# Patient Record
Sex: Male | Born: 1980 | Race: White | Hispanic: No | Marital: Single | Smoking: Former smoker
Health system: Southern US, Community
[De-identification: ages and names within clinical notes are randomized; demographics above are authoritative.]

## PROBLEM LIST (undated history)

## (undated) DIAGNOSIS — E785 Hyperlipidemia, unspecified: Secondary | ICD-10-CM

## (undated) DIAGNOSIS — J45909 Unspecified asthma, uncomplicated: Secondary | ICD-10-CM

## (undated) DIAGNOSIS — I10 Essential (primary) hypertension: Secondary | ICD-10-CM

## (undated) HISTORY — DX: Hyperlipidemia, unspecified: E78.5

## (undated) HISTORY — DX: Unspecified asthma, uncomplicated: J45.909

---
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
## 2004-07-07 ENCOUNTER — Emergency Department: Payer: Self-pay | Admitting: Emergency Medicine

## 2009-12-26 ENCOUNTER — Ambulatory Visit: Payer: Self-pay | Admitting: Internal Medicine

## 2010-04-15 ENCOUNTER — Emergency Department: Payer: Self-pay | Admitting: Emergency Medicine

## 2016-12-26 DIAGNOSIS — Z23 Encounter for immunization: Secondary | ICD-10-CM | POA: Diagnosis not present

## 2016-12-26 DIAGNOSIS — Z0000 Encounter for general adult medical examination without abnormal findings: Secondary | ICD-10-CM | POA: Diagnosis not present

## 2017-03-11 DIAGNOSIS — R69 Illness, unspecified: Secondary | ICD-10-CM | POA: Diagnosis not present

## 2017-03-11 DIAGNOSIS — R6889 Other general symptoms and signs: Secondary | ICD-10-CM | POA: Diagnosis not present

## 2017-04-03 DIAGNOSIS — R05 Cough: Secondary | ICD-10-CM | POA: Diagnosis not present

## 2017-04-03 DIAGNOSIS — J029 Acute pharyngitis, unspecified: Secondary | ICD-10-CM | POA: Diagnosis not present

## 2017-04-03 DIAGNOSIS — J40 Bronchitis, not specified as acute or chronic: Secondary | ICD-10-CM | POA: Diagnosis not present

## 2019-02-22 ENCOUNTER — Telehealth: Payer: Self-pay | Admitting: Emergency Medicine

## 2019-02-22 ENCOUNTER — Other Ambulatory Visit: Payer: Self-pay

## 2019-02-22 ENCOUNTER — Emergency Department: Payer: BC Managed Care – PPO

## 2019-02-22 ENCOUNTER — Inpatient Hospital Stay
Admission: EM | Admit: 2019-02-22 | Discharge: 2019-02-22 | DRG: 177 | Disposition: A | Discharge status: Home / Self Care | Payer: BC Managed Care – PPO | Attending: Internal Medicine | Admitting: Internal Medicine

## 2019-02-22 ENCOUNTER — Inpatient Hospital Stay (HOSPITAL_COMMUNITY)
Admission: AD | Admit: 2019-02-22 | Discharge: 2019-02-26 | DRG: 177 | Disposition: A | Discharge status: Home / Self Care | Payer: BC Managed Care – PPO | Source: Other Acute Inpatient Hospital | Attending: Internal Medicine | Admitting: Internal Medicine

## 2019-02-22 ENCOUNTER — Encounter (HOSPITAL_COMMUNITY): Payer: Self-pay | Admitting: Internal Medicine

## 2019-02-22 ENCOUNTER — Encounter: Payer: Self-pay | Admitting: Emergency Medicine

## 2019-02-22 DIAGNOSIS — J9601 Acute respiratory failure with hypoxia: Secondary | ICD-10-CM

## 2019-02-22 DIAGNOSIS — E860 Dehydration: Secondary | ICD-10-CM | POA: Diagnosis present

## 2019-02-22 DIAGNOSIS — R0600 Dyspnea, unspecified: Secondary | ICD-10-CM | POA: Diagnosis not present

## 2019-02-22 DIAGNOSIS — E871 Hypo-osmolality and hyponatremia: Secondary | ICD-10-CM | POA: Diagnosis present

## 2019-02-22 DIAGNOSIS — Z888 Allergy status to other drugs, medicaments and biological substances status: Secondary | ICD-10-CM | POA: Diagnosis not present

## 2019-02-22 DIAGNOSIS — F1010 Alcohol abuse, uncomplicated: Secondary | ICD-10-CM | POA: Insufficient documentation

## 2019-02-22 DIAGNOSIS — U071 COVID-19: Secondary | ICD-10-CM

## 2019-02-22 DIAGNOSIS — E876 Hypokalemia: Secondary | ICD-10-CM | POA: Diagnosis present

## 2019-02-22 DIAGNOSIS — A0839 Other viral enteritis: Secondary | ICD-10-CM | POA: Diagnosis present

## 2019-02-22 DIAGNOSIS — Z79899 Other long term (current) drug therapy: Secondary | ICD-10-CM

## 2019-02-22 DIAGNOSIS — J9691 Respiratory failure, unspecified with hypoxia: Secondary | ICD-10-CM | POA: Diagnosis present

## 2019-02-22 DIAGNOSIS — F419 Anxiety disorder, unspecified: Secondary | ICD-10-CM | POA: Diagnosis present

## 2019-02-22 DIAGNOSIS — R509 Fever, unspecified: Secondary | ICD-10-CM | POA: Diagnosis not present

## 2019-02-22 DIAGNOSIS — J1282 Pneumonia due to coronavirus disease 2019: Secondary | ICD-10-CM | POA: Diagnosis present

## 2019-02-22 DIAGNOSIS — D6959 Other secondary thrombocytopenia: Secondary | ICD-10-CM | POA: Diagnosis present

## 2019-02-22 HISTORY — DX: COVID-19: U07.1

## 2019-02-22 HISTORY — DX: Essential (primary) hypertension: I10

## 2019-02-22 LAB — RAPID URINE DRUG SCREEN, HOSP PERFORMED
Amphetamines: NOT DETECTED
Barbiturates: NOT DETECTED
Benzodiazepines: NOT DETECTED
Cocaine: NOT DETECTED
Opiates: NOT DETECTED
Tetrahydrocannabinol: NOT DETECTED

## 2019-02-22 LAB — CBC WITH DIFFERENTIAL/PLATELET
Abs Immature Granulocytes: 0.02 10*3/uL (ref 0.00–0.07)
Abs Immature Granulocytes: 0.02 10*3/uL (ref 0.00–0.07)
Basophils Absolute: 0 10*3/uL (ref 0.0–0.1)
Basophils Absolute: 0 10*3/uL (ref 0.0–0.1)
Basophils Relative: 0 %
Basophils Relative: 0 %
Eosinophils Absolute: 0 10*3/uL (ref 0.0–0.5)
Eosinophils Absolute: 0 10*3/uL (ref 0.0–0.5)
Eosinophils Relative: 0 %
Eosinophils Relative: 0 %
HCT: 41.6 % (ref 39.0–52.0)
HCT: 39.2 % (ref 39.0–52.0)
Hemoglobin: 14.7 g/dL (ref 13.0–17.0)
Hemoglobin: 13.8 g/dL (ref 13.0–17.0)
Immature Granulocytes: 0 %
Immature Granulocytes: 1 %
Lymphocytes Relative: 8 %
Lymphocytes Relative: 9 %
Lymphs Abs: 0.5 10*3/uL — ABNORMAL LOW (ref 0.7–4.0)
Lymphs Abs: 0.4 10*3/uL — ABNORMAL LOW (ref 0.7–4.0)
MCH: 32.2 pg (ref 26.0–34.0)
MCH: 32.5 pg (ref 26.0–34.0)
MCHC: 35.3 g/dL (ref 30.0–36.0)
MCHC: 35.2 g/dL (ref 30.0–36.0)
MCV: 91.2 fL (ref 80.0–100.0)
MCV: 92.5 fL (ref 80.0–100.0)
Monocytes Absolute: 0.4 10*3/uL (ref 0.1–1.0)
Monocytes Absolute: 0.2 10*3/uL (ref 0.1–1.0)
Monocytes Relative: 7 %
Monocytes Relative: 6 %
Neutro Abs: 5.5 10*3/uL (ref 1.7–7.7)
Neutro Abs: 3.2 10*3/uL (ref 1.7–7.7)
Neutrophils Relative %: 85 %
Neutrophils Relative %: 84 %
Platelets: 106 10*3/uL — ABNORMAL LOW (ref 150–400)
Platelets: 107 10*3/uL — ABNORMAL LOW (ref 150–400)
RBC: 4.56 MIL/uL (ref 4.22–5.81)
RBC: 4.24 MIL/uL (ref 4.22–5.81)
RDW: 11.7 % (ref 11.5–15.5)
RDW: 11.9 % (ref 11.5–15.5)
Smear Review: DECREASED
WBC: 6.5 10*3/uL (ref 4.0–10.5)
WBC: 3.8 10*3/uL — ABNORMAL LOW (ref 4.0–10.5)
nRBC: 0 % (ref 0.0–0.2)
nRBC: 0 % (ref 0.0–0.2)

## 2019-02-22 LAB — COMPREHENSIVE METABOLIC PANEL
ALT: 43 U/L (ref 0–44)
ALT: 47 U/L — ABNORMAL HIGH (ref 0–44)
AST: 75 U/L — ABNORMAL HIGH (ref 15–41)
AST: 77 U/L — ABNORMAL HIGH (ref 15–41)
Albumin: 3.7 g/dL (ref 3.5–5.0)
Albumin: 3.4 g/dL — ABNORMAL LOW (ref 3.5–5.0)
Alkaline Phosphatase: 39 U/L (ref 38–126)
Alkaline Phosphatase: 36 U/L — ABNORMAL LOW (ref 38–126)
Anion gap: 13 (ref 5–15)
Anion gap: 9 (ref 5–15)
BUN: 13 mg/dL (ref 6–20)
BUN: 11 mg/dL (ref 6–20)
CO2: 21 mmol/L — ABNORMAL LOW (ref 22–32)
CO2: 23 mmol/L (ref 22–32)
Calcium: 7.9 mg/dL — ABNORMAL LOW (ref 8.9–10.3)
Calcium: 8 mg/dL — ABNORMAL LOW (ref 8.9–10.3)
Chloride: 95 mmol/L — ABNORMAL LOW (ref 98–111)
Chloride: 101 mmol/L (ref 98–111)
Creatinine, Ser: 1.14 mg/dL (ref 0.61–1.24)
Creatinine, Ser: 1.05 mg/dL (ref 0.61–1.24)
GFR calc Af Amer: >60 mL/min (ref >60)
GFR calc Af Amer: >60 mL/min (ref >60)
GFR calc non Af Amer: >60 mL/min (ref >60)
GFR calc non Af Amer: >60 mL/min (ref >60)
Glucose, Bld: 101 mg/dL — ABNORMAL HIGH (ref 70–99)
Glucose, Bld: 133 mg/dL — ABNORMAL HIGH (ref 70–99)
Potassium: 3.3 mmol/L — ABNORMAL LOW (ref 3.5–5.1)
Potassium: 3.8 mmol/L (ref 3.5–5.1)
Sodium: 129 mmol/L — ABNORMAL LOW (ref 135–145)
Sodium: 133 mmol/L — ABNORMAL LOW (ref 135–145)
Total Bilirubin: 0.8 mg/dL (ref 0.3–1.2)
Total Bilirubin: 0.4 mg/dL (ref 0.3–1.2)
Total Protein: 7.7 g/dL (ref 6.5–8.1)
Total Protein: 6.9 g/dL (ref 6.5–8.1)

## 2019-02-22 LAB — URINALYSIS, ROUTINE W REFLEX MICROSCOPIC
Bacteria, UA: NONE SEEN
Bilirubin Urine: NEGATIVE
Glucose, UA: NEGATIVE mg/dL
Hgb urine dipstick: NEGATIVE
Ketones, ur: 20 mg/dL — AB
Leukocytes,Ua: NEGATIVE
Nitrite: NEGATIVE
Protein, ur: 30 mg/dL — AB
Specific Gravity, Urine: 1.016 (ref 1.005–1.030)
pH: 5 (ref 5.0–8.0)

## 2019-02-22 LAB — ETHANOL: Alcohol, Ethyl (B): <10 mg/dL (ref <10)

## 2019-02-22 LAB — MAGNESIUM
Magnesium: 2.5 mg/dL — ABNORMAL HIGH (ref 1.7–2.4)
Magnesium: 2.3 mg/dL (ref 1.7–2.4)

## 2019-02-22 LAB — C-REACTIVE PROTEIN
CRP: 12.9 mg/dL — ABNORMAL HIGH (ref <1.0)
CRP: 13.5 mg/dL — ABNORMAL HIGH (ref <1.0)

## 2019-02-22 LAB — PROCALCITONIN
Procalcitonin: <0.1 ng/mL
Procalcitonin: <0.1 ng/mL

## 2019-02-22 LAB — TYPE AND SCREEN
ABO/RH(D): A POS
Antibody Screen: NEGATIVE

## 2019-02-22 LAB — CBC
HCT: 38.5 % — ABNORMAL LOW (ref 39.0–52.0)
Hemoglobin: 13.5 g/dL (ref 13.0–17.0)
MCH: 32.4 pg (ref 26.0–34.0)
MCHC: 35.1 g/dL (ref 30.0–36.0)
MCV: 92.3 fL (ref 80.0–100.0)
Platelets: 96 10*3/uL — ABNORMAL LOW (ref 150–400)
RBC: 4.17 MIL/uL — ABNORMAL LOW (ref 4.22–5.81)
RDW: 11.8 % (ref 11.5–15.5)
WBC: 5.2 10*3/uL (ref 4.0–10.5)
nRBC: 0 % (ref 0.0–0.2)

## 2019-02-22 LAB — ABO/RH
ABO/RH(D): A POS
ABO/RH(D): A POS

## 2019-02-22 LAB — TROPONIN I (HIGH SENSITIVITY)
Troponin I (High Sensitivity): 5 ng/L (ref <18)
Troponin I (High Sensitivity): 4 ng/L (ref <18)
Troponin I (High Sensitivity): 3 ng/L (ref <18)

## 2019-02-22 LAB — PHOSPHORUS: Phosphorus: 2.7 mg/dL (ref 2.5–4.6)

## 2019-02-22 LAB — FERRITIN
Ferritin: 769 ng/mL — ABNORMAL HIGH (ref 24–336)
Ferritin: 738 ng/mL — ABNORMAL HIGH (ref 24–336)

## 2019-02-22 LAB — CREATININE, SERUM
Creatinine, Ser: 1.09 mg/dL (ref 0.61–1.24)
GFR calc Af Amer: >60 mL/min (ref >60)
GFR calc non Af Amer: >60 mL/min (ref >60)

## 2019-02-22 LAB — HIV ANTIBODY (ROUTINE TESTING W REFLEX): HIV Screen 4th Generation wRfx: NONREACTIVE

## 2019-02-22 LAB — BRAIN NATRIURETIC PEPTIDE
B Natriuretic Peptide: 19 pg/mL (ref 0.0–100.0)
B Natriuretic Peptide: 35.1 pg/mL (ref 0.0–100.0)

## 2019-02-22 LAB — D-DIMER, QUANTITATIVE: D-Dimer, Quant: 1.25 ug/mL-FEU — ABNORMAL HIGH (ref 0.00–0.50)

## 2019-02-22 LAB — TRIGLYCERIDES: Triglycerides: 112 mg/dL (ref <150)

## 2019-02-22 LAB — FIBRINOGEN: Fibrinogen: 635 mg/dL — ABNORMAL HIGH (ref 210–475)

## 2019-02-22 LAB — LACTATE DEHYDROGENASE: LDH: 442 U/L — ABNORMAL HIGH (ref 98–192)

## 2019-02-22 LAB — LACTIC ACID, PLASMA
Lactic Acid, Venous: 1 mmol/L (ref 0.5–1.9)
Lactic Acid, Venous: 1 mmol/L (ref 0.5–1.9)

## 2019-02-22 LAB — HEPATITIS B SURFACE ANTIGEN: Hepatitis B Surface Ag: NONREACTIVE

## 2019-02-22 LAB — FIBRIN DERIVATIVES D-DIMER (ARMC ONLY): Fibrin derivatives D-dimer (ARMC): 1387.99 ng/mL (FEU) — ABNORMAL HIGH (ref 0.00–499.00)

## 2019-02-22 MED ORDER — ENOXAPARIN SODIUM 40 MG/0.4ML ~~LOC~~ SOLN: 40.0000 mg | SUBCUTANEOUS | Status: DC

## 2019-02-22 MED ORDER — SODIUM CHLORIDE 0.9 % IV SOLN
200.0000 mg | Freq: Once | INTRAVENOUS | Status: AC
  Administered 2019-02-22: 200 mg via INTRAVENOUS
  Filled 2019-02-22: qty 200

## 2019-02-22 MED ORDER — LACTATED RINGERS IV SOLN: INTRAVENOUS | Status: DC

## 2019-02-22 MED ORDER — SODIUM CHLORIDE 0.9 % IV SOLN: 100.0000 mg | Freq: Every day | INTRAVENOUS | Status: DC

## 2019-02-22 MED ORDER — SODIUM CHLORIDE 0.9 % IV SOLN
100.0000 mg | Freq: Every day | INTRAVENOUS | Status: AC
  Administered 2019-02-23 – 2019-02-26 (×4): 100 mg via INTRAVENOUS
  Filled 2019-02-22 (×4): qty 20

## 2019-02-22 MED ORDER — ONDANSETRON HCL 4 MG/2ML IJ SOLN: 4.0000 mg | Freq: Four times a day (QID) | INTRAMUSCULAR | Status: DC | PRN

## 2019-02-22 MED ORDER — ONDANSETRON HCL 4 MG PO TABS: 4.0000 mg | ORAL_TABLET | Freq: Four times a day (QID) | ORAL | Status: DC | PRN

## 2019-02-22 MED ORDER — DEXAMETHASONE SODIUM PHOSPHATE 10 MG/ML IJ SOLN
6.0000 mg | INTRAMUSCULAR | Status: DC
  Administered 2019-02-23 – 2019-02-26 (×4): 6 mg via INTRAVENOUS
  Filled 2019-02-22 (×4): qty 1

## 2019-02-22 MED ORDER — THIAMINE HCL 100 MG/ML IJ SOLN
100.0000 mg | Freq: Every day | INTRAMUSCULAR | Status: DC
  Administered 2019-02-22 – 2019-02-23 (×2): 100 mg via INTRAVENOUS
  Filled 2019-02-22 (×4): qty 1

## 2019-02-22 MED ORDER — ALBUTEROL SULFATE HFA 108 (90 BASE) MCG/ACT IN AERS
2.0000 | INHALATION_SPRAY | Freq: Four times a day (QID) | RESPIRATORY_TRACT | Status: DC
  Filled 2019-02-22: qty 6.7

## 2019-02-22 MED ORDER — DEXAMETHASONE SODIUM PHOSPHATE 10 MG/ML IJ SOLN
6.0000 mg | Freq: Once | INTRAMUSCULAR | Status: AC
  Administered 2019-02-22: 12:00:00 6 mg via INTRAVENOUS
  Filled 2019-02-22: qty 1

## 2019-02-22 MED ORDER — ZINC SULFATE 220 (50 ZN) MG PO CAPS
220.0000 mg | ORAL_CAPSULE | Freq: Every day | ORAL | Status: DC
  Administered 2019-02-22 – 2019-02-26 (×5): 220 mg via ORAL
  Filled 2019-02-22 (×5): qty 1

## 2019-02-22 MED ORDER — ASCORBIC ACID 500 MG PO TABS
500.0000 mg | ORAL_TABLET | Freq: Every day | ORAL | Status: DC
  Administered 2019-02-22 – 2019-02-26 (×5): 500 mg via ORAL
  Filled 2019-02-22 (×5): qty 1

## 2019-02-22 MED ORDER — DEXAMETHASONE SODIUM PHOSPHATE 10 MG/ML IJ SOLN: 6.0000 mg | INTRAMUSCULAR | Status: DC

## 2019-02-22 MED ORDER — IPRATROPIUM-ALBUTEROL 20-100 MCG/ACT IN AERS
1.0000 | INHALATION_SPRAY | Freq: Four times a day (QID) | RESPIRATORY_TRACT | Status: DC
  Administered 2019-02-22 – 2019-02-26 (×13): 1 via RESPIRATORY_TRACT
  Filled 2019-02-22: qty 4

## 2019-02-22 MED ORDER — POLYETHYLENE GLYCOL 3350 17 G PO PACK: 17.0000 g | PACK | Freq: Every day | ORAL | Status: DC | PRN

## 2019-02-22 MED ORDER — GUAIFENESIN-DM 100-10 MG/5ML PO SYRP
10.0000 mL | ORAL_SOLUTION | ORAL | Status: DC | PRN
  Administered 2019-02-22 – 2019-02-23 (×5): 10 mL via ORAL
  Filled 2019-02-22 (×5): qty 10

## 2019-02-22 MED ORDER — SODIUM CHLORIDE 0.9 % IV SOLN: 200.0000 mg | Freq: Once | INTRAVENOUS | Status: DC

## 2019-02-22 MED ORDER — ASPIRIN EC 81 MG PO TBEC: 81.0000 mg | DELAYED_RELEASE_TABLET | Freq: Every day | ORAL | Status: DC

## 2019-02-22 MED ORDER — ENOXAPARIN SODIUM 60 MG/0.6ML ~~LOC~~ SOLN
60.0000 mg | SUBCUTANEOUS | Status: DC
  Administered 2019-02-22 – 2019-02-23 (×2): 60 mg via SUBCUTANEOUS
  Filled 2019-02-22 (×2): qty 0.6

## 2019-02-22 MED ORDER — HYDROCODONE-ACETAMINOPHEN 5-325 MG PO TABS: 1.0000 | ORAL_TABLET | ORAL | Status: DC | PRN

## 2019-02-22 MED ORDER — ACETAMINOPHEN 325 MG PO TABS: 650.0000 mg | ORAL_TABLET | Freq: Four times a day (QID) | ORAL | Status: DC | PRN

## 2019-02-22 MED ORDER — ASCORBIC ACID 500 MG PO TABS: 500.0000 mg | ORAL_TABLET | Freq: Every day | ORAL | Status: DC

## 2019-02-22 MED ORDER — SODIUM CHLORIDE 0.9 % IV SOLN
INTRAVENOUS | Status: DC
  Administered 2019-02-23: 1000 mL via INTRAVENOUS

## 2019-02-22 MED ORDER — PANTOPRAZOLE SODIUM 40 MG IV SOLR: 40.0000 mg | INTRAVENOUS | Status: DC

## 2019-02-22 MED ORDER — SODIUM CHLORIDE 0.9 % IV BOLUS
1000.0000 mL | Freq: Once | INTRAVENOUS | Status: AC
  Administered 2019-02-22: 1000 mL via INTRAVENOUS

## 2019-02-22 MED ORDER — SODIUM CHLORIDE 0.9 % IV SOLN
100.0000 mg | Freq: Every day | INTRAVENOUS | Status: DC
  Filled 2019-02-22: qty 20

## 2019-02-22 MED ORDER — HYDROCOD POLST-CPM POLST ER 10-8 MG/5ML PO SUER: 5.0000 mL | Freq: Two times a day (BID) | ORAL | Status: DC | PRN

## 2019-02-22 MED ORDER — ZINC SULFATE 220 (50 ZN) MG PO CAPS: 220.0000 mg | ORAL_CAPSULE | Freq: Every day | ORAL | Status: DC

## 2019-02-22 MED ORDER — FOLIC ACID 5 MG/ML IJ SOLN
1.0000 mg | Freq: Every day | INTRAMUSCULAR | Status: DC
  Administered 2019-02-22 – 2019-02-23 (×2): 1 mg via INTRAVENOUS
  Filled 2019-02-22 (×3): qty 0.2

## 2019-02-22 MED ORDER — ONDANSETRON HCL 4 MG/2ML IJ SOLN
4.0000 mg | Freq: Once | INTRAMUSCULAR | Status: AC
  Administered 2019-02-22: 4 mg via INTRAVENOUS
  Filled 2019-02-22: qty 2

## 2019-02-22 MED ORDER — TRAMADOL HCL 50 MG PO TABS: 50.0000 mg | ORAL_TABLET | Freq: Four times a day (QID) | ORAL | Status: DC | PRN

## 2019-02-22 NOTE — ED Notes (Signed)
Pt visualized in NAD, urinal emptied by this RN. Pt states he is feeling better. Remains on 3L via [place] at this time.

## 2019-02-22 NOTE — ED Provider Notes (Signed)
[facility] Emergency Department Provider Note  ____________________________________________   First MD Initiated Contact with Patient 02/22/19 1028     (approximate)  I have reviewed the triage vital signs and the nursing notes.   HISTORY  Chief Complaint Fever and Covid +    HPI Cody Koch is a 39 y.o. male who was Covid positive a few days ago who now presents with continued fevers, shortness of breath.  Patient states that his shortness of breath has been worsening, severe, nothing makes better, nothing makes it worse.  He is also been having fevers and unable to get his temperature down.  Last Tylenol this morning.  He also reports not eating and drinking as well.  While in the lobby he did have a syncopal episode but he did not hit his head.     History reviewed. No pertinent past medical history.  There are no problems to display for this patient.   History reviewed. No pertinent surgical history.  Prior to Admission medications   Not on File    Allergies Ceclor [cefaclor]  No family history on file.  Social History Social History   Tobacco Use  . Smoking status: Never Smoker  . Smokeless tobacco: Never Used  Substance Use Topics  . Alcohol use: Never  . Drug use: Not on file      Review of Systems Constitutional: Positive fevers and syncope Eyes: No visual changes. ENT: No sore throat. Cardiovascular: Denies chest pain. Respiratory: Positive shortness of breath Gastrointestinal: No abdominal pain.  Positive nausea no vomiting.  No diarrhea.  No constipation. Genitourinary: Negative for dysuria. Musculoskeletal: Negative for back pain. Skin: Negative for rash. Neurological: Negative for headaches, focal weakness or numbness. All other ROS negative ____________________________________________   PHYSICAL EXAM:  VITAL SIGNS: ED Triage Vitals  Enc Vitals Group     BP 02/22/19 1007 (!) 108/56     Pulse Rate  02/22/19 1007 79     Resp 02/22/19 1007 (!) 22     Temp 02/22/19 1007 (!) 100.4 F (38 C)     Temp Source 02/22/19 1007 Oral     SpO2 02/22/19 1007 91 %     Weight 02/22/19 1008 280 lb (127 kg)     Height 02/22/19 1008 6' (1.829 m)     Head Circumference --      Peak Flow --      Pain Score 02/22/19 1007 6     Pain Loc --      Pain Edu? --      Excl. in [place]? --     Constitutional: Alert and oriented. Eyes: Conjunctivae are normal. EOMI. Head: Atraumatic. Nose: No congestion/rhinnorhea. Mouth/Throat: Mucous membranes are moist.   Neck: No stridor. Trachea Midline. FROM Cardiovascular: Normal rate, regular rhythm. Grossly normal heart sounds.  Good peripheral circulation. Respiratory: Normal respiratory effort.  No retractions. Lungs CTAB.  Patient is satting 88% on room air.  Patient was placed on 2 L Gastrointestinal: Soft and nontender. No distention. No abdominal bruits.  Musculoskeletal: No lower extremity tenderness nor edema.  No joint effusions. Neurologic:  Normal speech and language. No gross focal neurologic deficits are appreciated.  Skin:  Skin is warm, dry and intact. No rash noted. Psychiatric: Mood and affect are normal. Speech and behavior are normal. GU: Deferred   ____________________________________________   LABS (all labs ordered are listed, but only abnormal results are displayed)  Labs Reviewed  CBC WITH DIFFERENTIAL/PLATELET - Abnormal; Notable for the  following components:      Result Value   Platelets 106 (*)    Lymphs Abs 0.5 (*)    All other components within normal limits  COMPREHENSIVE METABOLIC PANEL - Abnormal; Notable for the following components:   Sodium 129 (*)    Potassium 3.3 (*)    Chloride 95 (*)    CO2 21 (*)    Glucose, Bld 101 (*)    Calcium 7.9 (*)    AST 75 (*)    All other components within normal limits  FIBRIN DERIVATIVES D-DIMER ([facility] ONLY) - Abnormal; Notable for the following components:   Fibrin derivatives D-dimer  ([facility]) 1,387.99 (*)    All other components within normal limits  LACTATE DEHYDROGENASE - Abnormal; Notable for the following components:   LDH 442 (*)    All other components within normal limits  FERRITIN - Abnormal; Notable for the following components:   Ferritin 769 (*)    All other components within normal limits  FIBRINOGEN - Abnormal; Notable for the following components:   Fibrinogen 635 (*)    All other components within normal limits  CULTURE, BLOOD (ROUTINE X 2)  CULTURE, BLOOD (ROUTINE X 2)  LACTIC ACID, PLASMA  TRIGLYCERIDES  BRAIN NATRIURETIC PEPTIDE  LACTIC ACID, PLASMA  PROCALCITONIN  C-REACTIVE PROTEIN  URINALYSIS, ROUTINE W REFLEX MICROSCOPIC  TROPONIN I (HIGH SENSITIVITY)  TROPONIN I (HIGH SENSITIVITY)   ____________________________________________   ED ECG REPORT I, Concha Se, the attending physician, personally viewed and interpreted this ECG.  EKG is normal sinus rate of 88, no ST elevation, T wave inversion in lead III, normal intervals ____________________________________________  RADIOLOGY Vela Prose, personally viewed and evaluated these images (plain radiographs) as part of my medical decision making, as well as reviewing the written report by the radiologist.  ED MD interpretation: Bilateral infiltrates consistent with coronavirus  Official radiology report(s): DG Chest Port 1 View  Result Date: 02/22/2019 CLINICAL DATA:  Fever and shortness of breath. EXAM: PORTABLE CHEST 1 VIEW COMPARISON:  None. FINDINGS: The cardiac silhouette, mediastinal and hilar contours are within normal limits. Multifocal patchy lung infiltrates suggesting atypical/viral pneumonia such as COVID pneumonia. Recommend clinical correlation. No pleural effusions. The bony thorax is intact. IMPRESSION: Diffuse lung infiltrates. Electronically Signed   By: Rudie Meyer M.D.   On: 02/22/2019 11:33     ____________________________________________   PROCEDURES  Procedure(s) performed (including Critical Care):  .Critical Care Performed by: Concha Se, MD Authorized by: Concha Se, MD   Critical care provider statement:    Critical care time (minutes):  31   Critical care was necessary to treat or prevent imminent or life-threatening deterioration of the following conditions:  Respiratory failure   Critical care was time spent personally by me on the following activities:  Discussions with consultants, evaluation of patient's response to treatment, examination of patient, ordering and performing treatments and interventions, ordering and review of laboratory studies, ordering and review of radiographic studies, pulse oximetry, re-evaluation of patient's condition, obtaining history from patient or surrogate and review of old charts     ____________________________________________   INITIAL IMPRESSION / ASSESSMENT AND PLAN / ED COURSE  Cody Koch was evaluated in Emergency Department on 02/22/2019 for the symptoms described in the history of present illness. He was evaluated in the context of the global COVID-19 pandemic, which necessitated consideration that the patient might be at risk for infection with the SARS-CoV-2 virus that causes COVID-19. Institutional protocols and algorithms  that pertain to the evaluation of patients at risk for COVID-19 are in a state of rapid change based on information released by regulatory bodies including the CDC and federal and state organizations. These policies and algorithms were followed during the patient's care in the ED.    Patient presents with shortness of breath, fevers in the setting of coronavirus.  Most likely secondary to coronavirus.  Patient is hypoxic and will require admission.  Will get labs to evaluate for electrolyte abnormalities, AKI, bacterial pneumonia, bacteremia.  Hold off on antibiotics at this time given he is  well-appearing.  Patient's labs are reassuring.  Does have a little bit low sodium and chloride most consistent with some dehydration.  Patient is already getting fluids.  Patient is currently on 3 L of oxygen.  Will discuss with the hospital team admission.   ____________________________________________   FINAL CLINICAL IMPRESSION(S) / ED DIAGNOSES   Final diagnoses:  Pneumonia due to COVID-19 virus  Acute respiratory failure with hypoxia (HCC)      MEDICATIONS GIVEN DURING THIS VISIT:  Medications  dexamethasone (DECADRON) injection 6 mg (6 mg Intravenous Given 02/22/19 1131)  ondansetron (ZOFRAN) injection 4 mg (4 mg Intravenous Given 02/22/19 1131)  sodium chloride 0.9 % bolus 1,000 mL (1,000 mLs Intravenous New Bag/Given 02/22/19 1134)     ED Discharge Orders    None       Note:  This document was prepared using Dragon voice recognition software and may include unintentional dictation errors.   Concha Se, MD 02/22/19 [id number]

## 2019-02-22 NOTE — Discharge Summary (Signed)
Physician Discharge Summary  Cody Koch:[id number] DOB: 1980-12-12 DOA: 02/22/2019  PCP: System, Pcp Not In  Admit date: 02/22/2019 Discharge date: 02/22/2019  Time spent: 20 minutes  Recommendations for Outpatient Follow-up:  1.  [facility].  Discharge Diagnoses:  Active Problems:   Pneumonia due to COVID-19 virus   Hyponatremia   Hypokalemia  Covid-19 PNA: Pt to be started on Covid-19 regimen for acute respiratory syndrome due to covid-19 pneumonia. -remdesivir/decadron.vit c and zinc.  -oxygen to keep goal sat 93 and above. -additionally monitro liver and kidney function and supportive care.  Hyponatremia: -Mild due to dehydration. Will start pt on NS at 50 ml x 24 hours.  -repeat BMP and follow. Hypokalemia: Will give pt 20 meq of kcl x 1 iv run. -check magnesium and follow potassium    Discharge Condition: stable  Diet recommendation: soft diet  Filed Weights   02/22/19 1008  Weight: 127 kg    History of present illness:  Cody Koch is a 39 y.o. male with no significant medical history  Seen for fatigue, myalgia, diarrhea  And dad has covid-19, he has been quarantining himself since last Sunday when the symptoms started. Past two days he has been very short of breath and o2 on pulse oximetry was 80's so he came in today.   Hospital Course:  Pt started on remdesivir and covid-19 regimen and received email abotu plan for trasnfer to covid facility per flow manager. Pt agrees for transfer.   Discharge Exam: Vitals:   02/22/19 1500 02/22/19 1538  BP: 136/75 (!) 152/93  Pulse: 93 93  Resp: (!) 22 20  Temp:  99 F (37.2 C)  SpO2: 91% 93%   Eyes: PERRL, lids and conjunctivae normal ENMT: Mucous membranes are moist. Posterior pharynx clear of any exudate or lesions.Normal dentition.  Neck: normal, supple, no masses, no thyromegaly Respiratory: clear to auscultation bilaterally, no wheezing, no crackles. Normal respiratory effort. No accessory muscle use.   Cardiovascular: Regular rate and rhythm, no murmurs / rubs / gallops. No extremity edema. 2+ pedal pulses. No carotid bruits.  Abdomen: no tenderness, no masses palpated. No hepatosplenomegaly. Bowel sounds positive.  Musculoskeletal: no clubbing / cyanosis. No joint deformity upper and lower extremities. Good ROM, no contractures. Normal muscle tone.  Skin: no rashes, lesions, ulcers. No induration Neurologic: CN 2-12 grossly intact. Sensation intact, DTR normal. Strength 5/5 in all 4.  Psychiatric: Normal judgment and insight. Alert and oriented x 3. Normal mood.    Discharge Instructions   Discharge Instructions    Diet - low sodium heart healthy   Complete by: As directed    Increase activity slowly   Complete by: As directed       Allergies  Allergen Reactions  . Ceclor [Cefaclor] Anaphylaxis and Hives      Significant Diagnostic Studies: DG Chest Port 1 View  Result Date: 02/22/2019 CLINICAL DATA:  Fever and shortness of breath. EXAM: PORTABLE CHEST 1 VIEW COMPARISON:  None. FINDINGS: The cardiac silhouette, mediastinal and hilar contours are within normal limits. Multifocal patchy lung infiltrates suggesting atypical/viral pneumonia such as COVID pneumonia. Recommend clinical correlation. No pleural effusions. The bony thorax is intact. IMPRESSION: Diffuse lung infiltrates. Electronically Signed   By: Rudie Meyer M.D.   On: 02/22/2019 11:33    Microbiology: No results found for this or any previous visit (from the past 240 hour(s)).   Labs: Basic Metabolic Panel: Recent Labs  Lab 02/22/19 1101 02/22/19 1435  NA 129*  --  K 3.3*  --   CL 95*  --   CO2 21*  --   GLUCOSE 101*  --   BUN 13  --   CREATININE 1.14 1.09  CALCIUM 7.9*  --   MG  --  2.5*   Liver Function Tests: Recent Labs  Lab 02/22/19 1101  AST 75*  ALT 43  ALKPHOS 39  BILITOT 0.8  PROT 7.7  ALBUMIN 3.7   No results for input(s): LIPASE, AMYLASE in the last 168 hours. No results for  input(s): AMMONIA in the last 168 hours. CBC: Recent Labs  Lab 02/22/19 1101 02/22/19 1435  WBC 6.5 5.2  NEUTROABS 5.5  --   HGB 14.7 13.5  HCT 41.6 38.5*  MCV 91.2 92.3  PLT 106* 96*   Cardiac Enzymes: No results for input(s): CKTOTAL, CKMB, CKMBINDEX, TROPONINI in the last 168 hours. BNP: BNP (last 3 results) Recent Labs    02/22/19 1101  BNP 19.0    ProBNP (last 3 results) No results for input(s): PROBNP in the last 8760 hours.  CBG: No results for input(s): GLUCAP in the last 168 hours.  Signed:  Para Skeans MD.  [facility] 02/22/2019, 3:56 PM

## 2019-02-22 NOTE — ED Notes (Signed)
Report called to rebecca rn at [facility]

## 2019-02-22 NOTE — ED Notes (Signed)
[facility] here to get pt. [facility] unable to complete emtala. Pt transferred to [facility] and emtala completed and faxed to [facility]. Pt left at 1621

## 2019-02-22 NOTE — ED Notes (Signed)
Consent signed by patient for transfer to [facility].

## 2019-02-22 NOTE — ED Triage Notes (Signed)
Pt here with c/o fevers and states difficulty with keeping oxygen sats up at home. 94% in triage on RA, shallow breathing, dry cough, very pale in triage, states he feels like he is going to pass out, encouraged slow deep breaths.

## 2019-02-22 NOTE — H&P (Signed)
History and Physical    Cody Koch QQI:[id number] DOB: [birth year] DOA: 02/22/2019   PCP: System, Pcp Not In    Patient coming from: Home   Chief Complaint: Covid-19 Pneumonia  HPI: Cody Koch is a 39 y.o. male with no significant medical history  Seen for fatigue, myalgia, diarrhea  And dad has covid-19, he has been quarantining himself since last Sunday when the symptoms started. Past two days he has been very short of breath and o2 on pulse oximetry was 80's so he came in today.   ED Course:  Pt in ed is alert and give his own history.  Blood pressure 131/66, pulse 83, temperature (!) 100.4 F (38 C), temperature source Oral, resp. rate (!) 28, height 6' (1.829 m), weight 127 kg, SpO2 93 %.  Pt started on oxygen abd given abx   Review of Systems: As per HPI otherwise 10 point review of systems negative.   History reviewed. No pertinent past medical history.  History reviewed. No pertinent surgical history.   reports that he has never smoked. He has never used smokeless tobacco. He reports that he does not drink alcohol. No history on file for drug.  Allergies  Allergen Reactions  . Ceclor [Cefaclor] Anaphylaxis and Hives    No family history on file.   Prior to Admission medications   Medication Sig Start Date End Date Taking? Authorizing Provider  cholecalciferol (VITAMIN D3) 25 MCG (1000 UNIT) tablet Take 1,000 Units by mouth daily.   Yes [provider]  milk thistle 175 MG tablet Take 175 mg by mouth as directed.   Yes [provider]  Multiple Vitamin (MULTIVITAMIN WITH MINERALS) TABS tablet Take 1 tablet by mouth daily.   Yes [provider]  Multiple Vitamins-Minerals (AIRBORNE PO) Take 1 tablet by mouth as directed.   Yes [provider]  Turmeric 500 MG CAPS Take 500 mg by mouth as directed.   Yes [provider]  vitamin B-12 (CYANOCOBALAMIN) 1000 MCG tablet Take 1,000 mcg by mouth daily.   Yes [provider]    Physical Exam: Vitals:   02/22/19 1105 02/22/19 1130 02/22/19 1200 02/22/19 1230  BP:  (!) 106/59 117/71 131/66  Pulse:  92 85 83  Resp:  (!) 26 (!) 25 (!) 28  Temp:      TempSrc:      SpO2: 96% 91% 95% 93%  Weight:      Height:        Constitutional: NAD, calm, comfortable Vitals:   02/22/19 1105 02/22/19 1130 02/22/19 1200 02/22/19 1230  BP:  (!) 106/59 117/71 131/66  Pulse:  92 85 83  Resp:  (!) 26 (!) 25 (!) 28  Temp:      TempSrc:      SpO2: 96% 91% 95% 93%  Weight:      Height:       Eyes: PERRL, lids and conjunctivae normal ENMT: Mucous membranes are moist. Posterior pharynx clear of any exudate or lesions.Normal dentition.  Neck: normal, supple, no masses, no thyromegaly Respiratory: clear to auscultation bilaterally, no wheezing, no crackles. Normal respiratory effort. No accessory muscle use.  Cardiovascular: Regular rate and rhythm, no murmurs / rubs / gallops. No extremity edema. 2+ pedal pulses. No carotid bruits.  Abdomen: no tenderness, no masses palpated. No hepatosplenomegaly. Bowel sounds positive.  Musculoskeletal: no clubbing / cyanosis. No joint deformity upper and lower extremities. Good ROM, no contractures. Normal muscle tone.  Skin: no rashes, lesions,  ulcers. No induration Neurologic: CN 2-12 grossly intact. Sensation intact, DTR normal. Strength 5/5 in all 4.  Psychiatric: Normal judgment and insight. Alert and oriented x 3. Normal mood.   Labs on Admission: I have personally reviewed following labs and imaging studies  CBC: Recent Labs  Lab 02/22/19 1101  WBC 6.5  NEUTROABS 5.5  HGB 14.7  HCT 41.6  MCV 91.2  PLT 106*   Basic Metabolic Panel: Recent Labs  Lab 02/22/19 1101  NA 129*  K 3.3*  CL 95*  CO2 21*  GLUCOSE 101*  BUN 13  CREATININE 1.14  CALCIUM 7.9*   GFR: Estimated Creatinine Clearance: 123.4 mL/min (by C-G formula based on SCr of 1.14 mg/dL). Liver Function Tests: Recent Labs  Lab  02/22/19 1101  AST 75*  ALT 43  ALKPHOS 39  BILITOT 0.8  PROT 7.7  ALBUMIN 3.7   No results for input(s): LIPASE, AMYLASE in the last 168 hours. No results for input(s): AMMONIA in the last 168 hours. Coagulation Profile: No results for input(s): INR, PROTIME in the last 168 hours. Cardiac Enzymes: No results for input(s): CKTOTAL, CKMB, CKMBINDEX, TROPONINI in the last 168 hours. BNP (last 3 results) No results for input(s): PROBNP in the last 8760 hours. HbA1C: No results for input(s): HGBA1C in the last 72 hours. CBG: No results for input(s): GLUCAP in the last 168 hours. Lipid Profile: Recent Labs    02/22/19 1101  TRIG 112   Thyroid Function Tests: No results for input(s): TSH, T4TOTAL, FREET4, T3FREE, THYROIDAB in the last 72 hours. Anemia Panel: Recent Labs    02/22/19 1101  FERRITIN 769*   Urine analysis:    Component Value Date/Time   COLORURINE YELLOW (A) 02/22/2019 1053   APPEARANCEUR HAZY (A) 02/22/2019 1053   LABSPEC 1.016 02/22/2019 1053   PHURINE 5.0 02/22/2019 1053   GLUCOSEU NEGATIVE 02/22/2019 1053   HGBUR NEGATIVE 02/22/2019 1053   BILIRUBINUR NEGATIVE 02/22/2019 1053   KETONESUR 20 (A) 02/22/2019 1053   PROTEINUR 30 (A) 02/22/2019 1053   NITRITE NEGATIVE 02/22/2019 1053   LEUKOCYTESUR NEGATIVE 02/22/2019 1053    Radiological Exams on Admission: DG Chest Port 1 View  Result Date: 02/22/2019 CLINICAL DATA:  Fever and shortness of breath. EXAM: PORTABLE CHEST 1 VIEW COMPARISON:  None. FINDINGS: The cardiac silhouette, mediastinal and hilar contours are within normal limits. Multifocal patchy lung infiltrates suggesting atypical/viral pneumonia such as COVID pneumonia. Recommend clinical correlation. No pleural effusions. The bony thorax is intact. IMPRESSION: Diffuse lung infiltrates. Electronically Signed   By: Rudie Meyer M.D.   On: 02/22/2019 11:33    EKG: sinus rhythm and twi in lead iii.  Assessment/Plan Active Problems:   Pneumonia  due to COVID-19 virus   Hyponatremia   Hypokalemia  Covid-19 PNA: Pt to be started on Covid-19 regimen for acute respiratory syndrome due to covid-19 pneumonia. -remdesivir/decadron.vit c and zinc.  -oxygen to keep goal sat 93 and above. -additionally monitro liver and kidney function and supportive care.  Hyponatremia: -Mild due to dehydration. Will start pt on NS at 50 ml x 24 hours.  -repeat BMP and follow. Hypokalemia: Will give pt 20 meq of kcl x 1 iv run. -check magnesium and follow potassium      DVT prophylaxis: lovenox Code Status: full Family Communication: none Disposition Plan: home  Consults called: none  Admission status: inpatient    Gertha Calkin MD [facility] If 7PM-7AM, please contact night-coverage [email or web address] Password [email or web address]  02/22/2019, 2:20 PM

## 2019-02-22 NOTE — ED Notes (Signed)
Admitting MD at bedside at this time.

## 2019-02-22 NOTE — ED Notes (Signed)
Pt repositioned in bed, lights dimmed for patient comfort. Call bell remains within reach. Pt denies any further needs. Pt remains on 3L via [place] at this time.

## 2019-02-22 NOTE — H&P (Addendum)
[facility] History and Physical  Cody Koch JJO:[id number] DOB: 03/08/1980 DOA: 02/22/2019  Referring physician:  PCP: System, Pcp Not In   Chief Complaint:   HPI: Cody Koch is a 39 y.o. WM PMHx EtOH abuse (drinks 1 L of liquor per day) stopped cold [place] 1 week ago.    Seen for fatigue, myalgia, diarrhea  And dad has covid-19, he has been quarantining himself since last Sunday when the symptoms started. Past two days he has been very short of breath and o2 on pulse oximetry was 80's so he came in today.   ED Course:  Pt in ed is alert and give his own history.  Blood pressure 131/66, pulse 83, temperature (!) 100.4 F (38 C), temperature source Oral, resp. rate (!) 28, height 6' (1.829 m), weight 127 kg, SpO2 93 %.  Pt started on oxygen abd given abx     Review of Systems:  Constitutional:  No weight loss, night sweats, Fevers, chills, fatigue.  HEENT:  No headaches, Difficulty swallowing,Tooth/dental problems,Sore throat,  No sneezing, itching, ear ache, nasal congestion, post nasal drip,  Cardio-vascular:  No chest pain, Orthopnea, PND, swelling in lower extremities, anasarca, dizziness, palpitations  GI:  No heartburn, indigestion, (+) abdominal pain, nausea, vomiting, diarrhea, change in bowel habits, loss of appetite  Resp:  (+)shortness of breath with exertion or at rest. No excess mucus, no productive cough, No non-productive cough, No coughing up of blood.No change in color of mucus.No wheezing.No chest wall deformity  Skin:  no rash or lesions.  GU:  no dysuria, change in color of urine, no urgency or frequency. No flank pain.  Musculoskeletal:  No joint pain or swelling. No decreased range of motion. No back pain.  Psych:  No change in mood or affect. No depression or anxiety. No memory loss.   No past medical history on file. No past surgical history on file. Social History:  reports that he has never smoked. He has never used smokeless tobacco.  He reports that he does not drink alcohol. No history on file for drug.  Allergies  Allergen Reactions  . Ceclor [Cefaclor] Anaphylaxis and Hives    No family history on file.  Negative HTN, negative HLD, negative CHF, negative cancer  Prior to Admission medications   Medication Sig Start Date End Date Taking? Authorizing Provider  cholecalciferol (VITAMIN D3) 25 MCG (1000 UNIT) tablet Take 1,000 Units by mouth daily.    [provider]  milk thistle 175 MG tablet Take 175 mg by mouth as directed.    [provider]  Multiple Vitamin (MULTIVITAMIN WITH MINERALS) TABS tablet Take 1 tablet by mouth daily.    [provider]  Multiple Vitamins-Minerals (AIRBORNE PO) Take 1 tablet by mouth as directed.    [provider]  Turmeric 500 MG CAPS Take 500 mg by mouth as directed.    [provider]  vitamin B-12 (CYANOCOBALAMIN) 1000 MCG tablet Take 1,000 mcg by mouth daily.    [provider]     Consultants:    Procedures/Significant Events:  2/8 PCXR;-multifocal patchy lung infiltrates suggesting atypical/viral pneumonia such as COVID pneumonia  I have personally reviewed and interpreted all radiology studies and my findings are as above.   VENTILATOR SETTINGS: Nasal cannula 2/8 SPO2; 95%   Cultures 2/8 respiratory virus panel pending 2/8 blood pending   Antimicrobials:    Devices    LINES / TUBES:      Continuous Infusions:  Physical  Exam: There were no vitals filed for this visit.  Wt Readings from Last 3 Encounters:  02/22/19 127 kg    General: A/O x4, positive acute respiratory distress Eyes: negative scleral hemorrhage, negative anisocoria, negative icterus ENT: Negative Runny nose, negative gingival bleeding, Neck:  Negative scars, masses, torticollis, lymphadenopathy, JVD Lungs: Decreased breath sounds bilaterally without wheezes or crackles Cardiovascular: Regular rate and rhythm without murmur  gallop or rub normal S1 and S2 Abdomen: negative abdominal pain, nondistended, positive soft, bowel sounds, no rebound, no ascites, no appreciable mass Extremities: No significant cyanosis, clubbing, or edema bilateral lower extremities Skin: Negative rashes, lesions, ulcers Psychiatric:  Negative depression, negative anxiety, negative fatigue, negative mania  Central nervous system:  Cranial nerves II through XII intact, tongue/uvula midline, all extremities muscle strength 5/5, sensation intact throughout, negative dysarthria, negative expressive aphasia, negative receptive aphasia.        Labs on Admission:  Basic Metabolic Panel: Recent Labs  Lab 02/22/19 1101 02/22/19 1435  NA 129*  --   K 3.3*  --   CL 95*  --   CO2 21*  --   GLUCOSE 101*  --   BUN 13  --   CREATININE 1.14 1.09  CALCIUM 7.9*  --   MG  --  2.5*   Liver Function Tests: Recent Labs  Lab 02/22/19 1101  AST 75*  ALT 43  ALKPHOS 39  BILITOT 0.8  PROT 7.7  ALBUMIN 3.7   No results for input(s): LIPASE, AMYLASE in the last 168 hours. No results for input(s): AMMONIA in the last 168 hours. CBC: Recent Labs  Lab 02/22/19 1101 02/22/19 1435  WBC 6.5 5.2  NEUTROABS 5.5  --   HGB 14.7 13.5  HCT 41.6 38.5*  MCV 91.2 92.3  PLT 106* 96*   Cardiac Enzymes: No results for input(s): CKTOTAL, CKMB, CKMBINDEX, TROPONINI in the last 168 hours.  BNP (last 3 results) Recent Labs    02/22/19 1101  BNP 19.0    ProBNP (last 3 results) No results for input(s): PROBNP in the last 8760 hours.  CBG: No results for input(s): GLUCAP in the last 168 hours.  Radiological Exams on Admission: DG Chest Port 1 View  Result Date: 02/22/2019 CLINICAL DATA:  Fever and shortness of breath. EXAM: PORTABLE CHEST 1 VIEW COMPARISON:  None. FINDINGS: The cardiac silhouette, mediastinal and hilar contours are within normal limits. Multifocal patchy lung infiltrates suggesting atypical/viral pneumonia such as COVID pneumonia.  Recommend clinical correlation. No pleural effusions. The bony thorax is intact. IMPRESSION: Diffuse lung infiltrates. Electronically Signed   By: Rudie Meyer M.D.   On: 02/22/2019 11:33    EKG: 2/8  Flipped T waves in leads III aVF. 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  Assessment/Plan Active Problems:   Pneumonia due to COVID-19 virus   Hyponatremia   Hypokalemia   Respiratory failure with hypoxia (HCC)   Gastroenteritis due to COVID-19 virus   ETOH abuse  Covid pneumonia/acute respiratory failure with hypoxia COVID-19 Labs  Recent Labs    02/22/19 1101  FERRITIN 769*  LDH 442*  CRP 12.9*    No results found for: SARSCOV2NAA -Decadron 6 mg daily -Remdesivir per pharmacy protocol -Combivent -Vitamins per pharmacy protocol -Titrate O2 to maintain SPO2> 88% -Prone patient 16 hours/day; if patient cannot tolerate prone 2 to 3 hours per shift  Covid gastroenteritis -See hyponatremia -See Covid pneumonia  Inferior MI -EKG: 2/8  Flipped T waves in leads III aVF. 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 -Currently negative CP -  Trend troponin -Obtain BNP -Echocardiogram pending  Hyponatremia -Multifactorial Covid gastroenteritis, EtOH abuse -Normal saline 135ml/hr -Strict ins and outs -Daily weight  Hypokalemia -Potassium goal> 4 -At [facility] received KCl 20 mEq -Repeat labs pending  EtOH abuse -Per patient drinks 1 L of liquor per day; stopped cold [place] 1 week ago. -EtOH pending -Urine drug screen pending -Folic acid 1 mg daily -Thiamine 100 mg daily  N/V/D -Resolved    Code Status: Full (DVT Prophylaxis: Lovenox Family Communication:   Disposition Plan:    Data Reviewed: Care during the described time interval was provided by me .  I have reviewed this patient's available data, including medical history, events of note, physical examination, and all test results as part of my evaluation.   The patient is critically ill with multiple organ systems failure and requires  high complexity decision making for assessment and support, frequent evaluation and titration of therapies, application of advanced monitoring technologies and extensive interpretation of multiple databases. Critical Care Time devoted to patient care services described in this note  Time spent: 70 minutes   Jacoya Bauman, Roselind Messier [facility] Pager [phone]

## 2019-02-22 NOTE — ED Provider Notes (Signed)
Asked to update EMTALA prior to transfer. Dr. Allena Katz had completed but needs re-assessment. Pt HDS, VSS. Risks/benefits of transfer discussed.   Shaune Pollack, MD 02/22/19 [phone]

## 2019-02-22 NOTE — ED Notes (Signed)
Message sent to pharmacy regarding patient meds.

## 2019-02-23 ENCOUNTER — Inpatient Hospital Stay (HOSPITAL_COMMUNITY): Payer: BC Managed Care – PPO

## 2019-02-23 DIAGNOSIS — R0600 Dyspnea, unspecified: Secondary | ICD-10-CM

## 2019-02-23 LAB — COMPREHENSIVE METABOLIC PANEL
ALT: 48 U/L — ABNORMAL HIGH (ref 0–44)
AST: 76 U/L — ABNORMAL HIGH (ref 15–41)
Albumin: 3 g/dL — ABNORMAL LOW (ref 3.5–5.0)
Alkaline Phosphatase: 33 U/L — ABNORMAL LOW (ref 38–126)
Anion gap: 8 (ref 5–15)
BUN: 12 mg/dL (ref 6–20)
CO2: 24 mmol/L (ref 22–32)
Calcium: 7.8 mg/dL — ABNORMAL LOW (ref 8.9–10.3)
Chloride: 103 mmol/L (ref 98–111)
Creatinine, Ser: 1.05 mg/dL (ref 0.61–1.24)
GFR calc Af Amer: >60 mL/min (ref >60)
GFR calc non Af Amer: >60 mL/min (ref >60)
Glucose, Bld: 105 mg/dL — ABNORMAL HIGH (ref 70–99)
Potassium: 4 mmol/L (ref 3.5–5.1)
Sodium: 135 mmol/L (ref 135–145)
Total Bilirubin: 0.7 mg/dL (ref 0.3–1.2)
Total Protein: 6.6 g/dL (ref 6.5–8.1)

## 2019-02-23 LAB — CBC WITH DIFFERENTIAL/PLATELET
Abs Immature Granulocytes: 0.02 10*3/uL (ref 0.00–0.07)
Basophils Absolute: 0 10*3/uL (ref 0.0–0.1)
Basophils Relative: 0 %
Eosinophils Absolute: 0 10*3/uL (ref 0.0–0.5)
Eosinophils Relative: 0 %
HCT: 37.3 % — ABNORMAL LOW (ref 39.0–52.0)
Hemoglobin: 12.7 g/dL — ABNORMAL LOW (ref 13.0–17.0)
Immature Granulocytes: 1 %
Lymphocytes Relative: 16 %
Lymphs Abs: 0.6 10*3/uL — ABNORMAL LOW (ref 0.7–4.0)
MCH: 32.1 pg (ref 26.0–34.0)
MCHC: 34 g/dL (ref 30.0–36.0)
MCV: 94.2 fL (ref 80.0–100.0)
Monocytes Absolute: 0.5 10*3/uL (ref 0.1–1.0)
Monocytes Relative: 12 %
Neutro Abs: 2.7 10*3/uL (ref 1.7–7.7)
Neutrophils Relative %: 71 %
Platelets: 99 10*3/uL — ABNORMAL LOW (ref 150–400)
RBC: 3.96 MIL/uL — ABNORMAL LOW (ref 4.22–5.81)
RDW: 11.9 % (ref 11.5–15.5)
WBC: 3.8 10*3/uL — ABNORMAL LOW (ref 4.0–10.5)
nRBC: 0 % (ref 0.0–0.2)

## 2019-02-23 LAB — D-DIMER, QUANTITATIVE: D-Dimer, Quant: 1.17 ug/mL-FEU — ABNORMAL HIGH (ref 0.00–0.50)

## 2019-02-23 LAB — TROPONIN I (HIGH SENSITIVITY): Troponin I (High Sensitivity): 3 ng/L (ref <18)

## 2019-02-23 LAB — C-REACTIVE PROTEIN: CRP: 11.3 mg/dL — ABNORMAL HIGH (ref <1.0)

## 2019-02-23 LAB — ECHOCARDIOGRAM COMPLETE
Height: 73 in
Weight: 4536.18 oz

## 2019-02-23 LAB — MAGNESIUM: Magnesium: 2.3 mg/dL (ref 1.7–2.4)

## 2019-02-23 LAB — PHOSPHORUS: Phosphorus: 3.1 mg/dL (ref 2.5–4.6)

## 2019-02-23 LAB — FERRITIN: Ferritin: 650 ng/mL — ABNORMAL HIGH (ref 24–336)

## 2019-02-23 NOTE — Progress Notes (Signed)
Received fax from [facility] of patients test for covid with positive results.  Results fax page placed in paients chart.

## 2019-02-23 NOTE — Progress Notes (Addendum)
PROGRESS NOTE    Cody Koch  ZOX:[id number] DOB: 10-25-1980 DOA: 02/22/2019 PCP: System, Pcp Not In   Brief Narrative:  Cody Koch a 39 y.o.WM PMHx EtOH abuse (drinks 1 L of liquor per day) stopped cold [place] 1 week ago.    Seen for fatigue, myalgia, diarrhea And dad has covid-19, he has been quarantining himself since last "Sunday when the symptoms started. Past two days he has been very short of breath and o2 on pulse oximetry was 80's so he came in today.   ED Course: Pt in ed is alert and give his own history. Blood pressure 131/66, pulse 83, temperature (!) 100.4 F (38 C), temperature source Oral, resp. rate (!) 28, height 6' (1.829 m), weight 127 kg, SpO2 93 %.  Pt started on oxygen abd given abx   Subjective: T-max overnight 38 C, A/O x4, positive S OB.  Negative abdominal pain, negative N/V   Assessment & Plan:   Active Problems:   Pneumonia due to COVID-19 virus   Hyponatremia   Hypokalemia   Respiratory failure with hypoxia (HCC)   Gastroenteritis due to COVID-19 virus   ETOH abuse   Covid pneumonia/acute respiratory failure with hypoxia COVID-19 Labs  Recent Labs    02/22/19 1101 02/22/19 1855 02/23/19 0405  DDIMER  --  1.25* 1.17*  FERRITIN 769* 738* 650*  LDH 442*  --   --   CRP 12.9* 13.5* 11.3*    2/3/ SARS Cov+-2 Antigen Test positive (outside facility)   -Decadron 6 mg daily -Remdesivir per pharmacy protocol -Combivent -Vitamins per Covid protocol -Titrate O2 to maintain SPO2> 88% -Prone patient 16 hours/day; if patient cannot tolerate prone 2 to 3 hours per shift  Covid gastroenteritis -See hyponatremia -See Covid pneumonia  Inferior MI -EKG: 2/8  Flipped T waves in leads III aVF. 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 -Currently negative CP -Trend troponin -Obtain BNP -Echocardiogram; mid pending  Hyponatremia -Multifactorial Covid gastroenteritis, EtOH abuse -Normal saline 100ml/hr -Strict ins and outs -1.5 L -Daily  weight Filed Weights   02/22/19 1819 02/23/19 0423  Weight: 127 kg 128.6 kg   Hypokalemia -Potassium goal> 4 --Resolved  EtOH abuse -Per patient drinks 1 L of liquor per day; stopped cold [place] 1 week ago. -EtOH negative -Urine drug screen negative -Folic acid 1 mg daily -Thiamine 100 mg daily  N/V/D -Resolved    DVT prophylaxis: Lovenox  Code Status: Full Family Communication: 2/9  attempted to call Cody Koch (father) no one answered. Disposition Plan:    Consultants:    Procedures/Significant Events:  2/9 Echocardiogram; Left Ventricle: EF=  55 -60%.-no regional wall motion abnormalities.  Aortic Valve: The aortic valve is normal in structure and function. Aortic  valve regurgitation is not visualized. No aortic stenosis is present.     I have personally reviewed and interpreted all radiology studies and my findings are as above.  VENTILATOR SETTINGS: Nasal cannula 2/9 Flow; 3 L/min SPO2; 93%   Cultures 2/3/ SARS Cov+-2 Antigen Test positive (outside facility) 2/8 respiratory virus panel pending 2/8 blood LEFT forearm NGTD 2/8 blood LEFT hand NGTD   Antimicrobials: Anti-infectives (From admission, onward)   Start     Dose/Rate Stop   02/23/19 1000  remdesivir 100 mg in sodium chloride 0.9 % 100 mL IVPB  Status:  Discontinued     10" 0 mg 200 mL/hr over 30 Minutes 02/22/19 1739   02/23/19 1000  remdesivir 100 mg in sodium chloride 0.9 % 100 mL IVPB  100 mg 200 mL/hr over 30 Minutes 02/27/19 0959   02/22/19 1730  remdesivir 200 mg in sodium chloride 0.9% 250 mL IVPB  Status:  Discontinued     200 mg 580 mL/hr over 30 Minutes 02/22/19 1739       Devices    LINES / TUBES:      Continuous Infusions: . sodium chloride 100 mL/hr at 02/23/19 0421  . remdesivir 100 mg in NS 100 mL       Objective: Vitals:   02/22/19 2000 02/22/19 2324 02/23/19 0411 02/23/19 0423  BP:  (!) 113/48 (!) 102/54   Pulse: 97 82 82   Resp:  20 (!) 23   Temp:   97.8 F (36.6 C) 97.8 F (36.6 C)   TempSrc:  Oral Oral   SpO2:  92% 92%   Weight:    128.6 kg  Height:        Intake/Output Summary (Last 24 hours) at 02/23/2019 5638 Last data filed at 02/23/2019 7564 Gross per 24 hour  Intake 1000 ml  Output 1900 ml  Net -900 ml   Filed Weights   02/22/19 1819 02/23/19 0423  Weight: 127 kg 128.6 kg    Examination:  General: A/O x4, positive acute respiratory distress Eyes: negative scleral hemorrhage, negative anisocoria, negative icterus ENT: Negative Runny nose, negative gingival bleeding, Neck:  Negative scars, masses, torticollis, lymphadenopathy, JVD Lungs: Clear to auscultation bilaterally without wheezes or crackles Cardiovascular: Regular rate and rhythm without murmur gallop or rub normal S1 and S2 Abdomen: negative abdominal pain, nondistended, positive soft, bowel sounds, no rebound, no ascites, no appreciable mass Extremities: No significant cyanosis, clubbing, or edema bilateral lower extremities Skin: Negative rashes, lesions, ulcers Psychiatric:  Negative depression, negative anxiety, negative fatigue, negative mania  Central nervous system:  Cranial nerves II through XII intact, tongue/uvula midline, all extremities muscle strength 5/5, sensation intact throughout, negative dysarthria, negative expressive aphasia, negative receptive aphasia.  .     Data Reviewed: Care during the described time interval was provided by me .  I have reviewed this patient's available data, including medical history, events of note, physical examination, and all test results as part of my evaluation.   CBC: Recent Labs  Lab 02/22/19 1101 02/22/19 1435 02/22/19 1855 02/23/19 0405  WBC 6.5 5.2 3.8* 3.8*  NEUTROABS 5.5  --  3.2 2.7  HGB 14.7 13.5 13.8 12.7*  HCT 41.6 38.5* 39.2 37.3*  MCV 91.2 92.3 92.5 94.2  PLT 106* 96* 107* 99*   Basic Metabolic Panel: Recent Labs  Lab 02/22/19 1101 02/22/19 1435 02/22/19 1855 02/23/19 0405  NA  129*  --  133* 135  K 3.3*  --  3.8 4.0  CL 95*  --  101 103  CO2 21*  --  23 24  GLUCOSE 101*  --  133* 105*  BUN 13  --  11 12  CREATININE 1.14 1.09 1.05 1.05  CALCIUM 7.9*  --  8.0* 7.8*  MG  --  2.5* 2.3 2.3  PHOS  --   --  2.7 3.1   GFR: Estimated Creatinine Clearance: 136.7 mL/min (by C-G formula based on SCr of 1.05 mg/dL). Liver Function Tests: Recent Labs  Lab 02/22/19 1101 02/22/19 1855 02/23/19 0405  AST 75* 77* 76*  ALT 43 47* 48*  ALKPHOS 39 36* 33*  BILITOT 0.8 0.4 0.7  PROT 7.7 6.9 6.6  ALBUMIN 3.7 3.4* 3.0*   No results for input(s): LIPASE, AMYLASE in the last 168 hours.  No results for input(s): AMMONIA in the last 168 hours. Coagulation Profile: No results for input(s): INR, PROTIME in the last 168 hours. Cardiac Enzymes: No results for input(s): CKTOTAL, CKMB, CKMBINDEX, TROPONINI in the last 168 hours. BNP (last 3 results) No results for input(s): PROBNP in the last 8760 hours. HbA1C: No results for input(s): HGBA1C in the last 72 hours. CBG: No results for input(s): GLUCAP in the last 168 hours. Lipid Profile: Recent Labs    02/22/19 1101  TRIG 112   Thyroid Function Tests: No results for input(s): TSH, T4TOTAL, FREET4, T3FREE, THYROIDAB in the last 72 hours. Anemia Panel: Recent Labs    02/22/19 1855 02/23/19 0405  FERRITIN 738* 650*   Urine analysis:    Component Value Date/Time   COLORURINE YELLOW (A) 02/22/2019 1053   APPEARANCEUR HAZY (A) 02/22/2019 1053   LABSPEC 1.016 02/22/2019 1053   PHURINE 5.0 02/22/2019 1053   GLUCOSEU NEGATIVE 02/22/2019 1053   HGBUR NEGATIVE 02/22/2019 1053   BILIRUBINUR NEGATIVE 02/22/2019 1053   KETONESUR 20 (A) 02/22/2019 1053   PROTEINUR 30 (A) 02/22/2019 1053   NITRITE NEGATIVE 02/22/2019 1053   LEUKOCYTESUR NEGATIVE 02/22/2019 1053   Sepsis Labs: @LABRCNTIP (procalcitonin:4,lacticidven:4)  ) Recent Results (from the past 240 hour(s))  Blood Culture (routine x 2)     Status: None  (Preliminary result)   Collection Time: 02/22/19 11:01 AM   Specimen: BLOOD  Result Value Ref Range Status   Specimen Description BLOOD BLOOD LEFT FOREARM  Final   Special Requests   Final    BOTTLES DRAWN AEROBIC AND ANAEROBIC Blood Culture adequate volume   Culture   Final    NO GROWTH < 24 HOURS Performed at [facility], [address], [place], [place] [address]    Report Status PENDING  Incomplete  Blood Culture (routine x 2)     Status: None (Preliminary result)   Collection Time: 02/22/19 11:06 AM   Specimen: BLOOD  Result Value Ref Range Status   Specimen Description BLOOD BLOOD LEFT HAND  Final   Special Requests   Final    BOTTLES DRAWN AEROBIC AND ANAEROBIC Blood Culture adequate volume   Culture   Final    NO GROWTH < 24 HOURS Performed at [facility], [address] [place], [place] [address]    Report Status PENDING  Incomplete         Radiology Studies: DG Chest Port 1 View  Result Date: 02/22/2019 CLINICAL DATA:  Fever and shortness of breath. EXAM: PORTABLE CHEST 1 VIEW COMPARISON:  None. FINDINGS: The cardiac silhouette, mediastinal and hilar contours are within normal limits. Multifocal patchy lung infiltrates suggesting atypical/viral pneumonia such as COVID pneumonia. Recommend clinical correlation. No pleural effusions. The bony thorax is intact. IMPRESSION: Diffuse lung infiltrates. Electronically Signed   By: Marijo Sanes M.D.   On: 02/22/2019 11:33        Scheduled Meds: . vitamin C  500 mg Oral Daily  . dexamethasone (DECADRON) injection  6 mg Intravenous Q24H  . enoxaparin (LOVENOX) injection  60 mg Subcutaneous Q24H  . folic acid  1 mg Intravenous Daily  . Ipratropium-Albuterol  1 puff Inhalation QID  . thiamine injection  100 mg Intravenous Daily  . zinc sulfate  220 mg Oral Daily   Continuous Infusions: . sodium chloride 100 mL/hr at 02/23/19 0421  . remdesivir 100 mg in NS 100 mL       LOS: 1 day   The  patient is critically ill  with multiple organ systems failure and requires high complexity decision making for assessment and support, frequent evaluation and titration of therapies, application of advanced monitoring technologies and extensive interpretation of multiple databases. Critical Care Time devoted to patient care services described in this note  Time spent: 40 minutes     Jonea Bukowski, Roselind Messier, MD [facility] Pager [phone]  If 7PM-7AM, please contact night-coverage [email or web address] Password [facility] 02/23/2019, 7:37 AM

## 2019-02-23 NOTE — Progress Notes (Signed)
  Echocardiogram 2D Echocardiogram has been performed.  Leta Jungling M 02/23/2019, 8:00 AM

## 2019-02-23 NOTE — Progress Notes (Signed)
ECHO done this am.  Saturation finger probe changed this am.

## 2019-02-23 NOTE — Plan of Care (Signed)
Patient had a good day, received medications, seems to be coping with plan of care.  Patient OOB to chair today.  UOP is very good.  No temp.   Gave Robitussin 3 times today with effective results.    Patient talking about doing better example not continuing the drinking of 1 L [place] a day.    Problem: Coping: Goal: Level of anxiety will decrease 02/23/2019 1829 by Valora Corporal, RN Outcome: Progressing 02/23/2019 1820 by Valora Corporal, RN Outcome: Progressing   Problem: Elimination: Goal: Will not experience complications related to bowel motility 02/23/2019 1829 by Valora Corporal, RN Outcome: Progressing 02/23/2019 1820 by Valora Corporal, RN Outcome: Progressing Goal: Will not experience complications related to urinary retention 02/23/2019 1829 by Valora Corporal, RN Outcome: Progressing 02/23/2019 1820 by Valora Corporal, RN Outcome: Progressing   Problem: Pain Managment: Goal: General experience of comfort will improve 02/23/2019 1829 by Valora Corporal, RN Outcome: Progressing 02/23/2019 1820 by Valora Corporal, RN Outcome: Progressing   Problem: Safety: Goal: Ability to remain free from injury will improve 02/23/2019 1829 by Valora Corporal, RN Outcome: Progressing 02/23/2019 1820 by Valora Corporal, RN Outcome: Progressing   Problem: Safety: Goal: Ability to remain free from injury will improve 02/23/2019 1829 by Valora Corporal, RN Outcome: Progressing 02/23/2019 1820 by Valora Corporal, RN Outcome: Progressing   Problem: Skin Integrity: Goal: Risk for impaired skin integrity will decrease 02/23/2019 1829 by Valora Corporal, RN Outcome: Progressing 02/23/2019 1820 by Valora Corporal, RN Outcome: Progressing   Problem: Education: Goal: Knowledge of General Education information will improve Description: Including pain rating scale, medication(s)/side effects and non-pharmacologic comfort measures Outcome: Progressing   Problem:  Health Behavior/Discharge Planning: Goal: Ability to manage health-related needs will improve Outcome: Progressing   Problem: Clinical Measurements: Goal: Ability to maintain clinical measurements within normal limits will improve Outcome: Progressing Goal: Will remain free from infection Outcome: Progressing Goal: Diagnostic test results will improve Outcome: Progressing Goal: Respiratory complications will improve Outcome: Progressing Goal: Cardiovascular complication will be avoided Outcome: Progressing   Problem: Activity: Goal: Risk for activity intolerance will decrease Outcome: Progressing   Problem: Nutrition: Goal: Adequate nutrition will be maintained Outcome: Progressing   Problem: Coping: Goal: Level of anxiety will decrease Outcome: Progressing   Problem: Elimination: Goal: Will not experience complications related to bowel motility Outcome: Progressing Goal: Will not experience complications related to urinary retention Outcome: Progressing   Problem: Pain Managment: Goal: General experience of comfort will improve Outcome: Progressing   Problem: Safety: Goal: Ability to remain free from injury will improve Outcome: Progressing   Problem: Skin Integrity: Goal: Risk for impaired skin integrity will decrease Outcome: Progressing

## 2019-02-24 LAB — CBC WITH DIFFERENTIAL/PLATELET
Abs Immature Granulocytes: 0.03 10*3/uL (ref 0.00–0.07)
Basophils Absolute: 0 10*3/uL (ref 0.0–0.1)
Basophils Relative: 0 %
Eosinophils Absolute: 0 10*3/uL (ref 0.0–0.5)
Eosinophils Relative: 0 %
HCT: 40.1 % (ref 39.0–52.0)
Hemoglobin: 13.3 g/dL (ref 13.0–17.0)
Immature Granulocytes: 1 %
Lymphocytes Relative: 18 %
Lymphs Abs: 0.9 10*3/uL (ref 0.7–4.0)
MCH: 31.7 pg (ref 26.0–34.0)
MCHC: 33.2 g/dL (ref 30.0–36.0)
MCV: 95.7 fL (ref 80.0–100.0)
Monocytes Absolute: 0.5 10*3/uL (ref 0.1–1.0)
Monocytes Relative: 9 %
Neutro Abs: 3.7 10*3/uL (ref 1.7–7.7)
Neutrophils Relative %: 72 %
Platelets: 168 10*3/uL (ref 150–400)
RBC: 4.19 MIL/uL — ABNORMAL LOW (ref 4.22–5.81)
RDW: 11.9 % (ref 11.5–15.5)
WBC: 5.2 10*3/uL (ref 4.0–10.5)
nRBC: 0 % (ref 0.0–0.2)

## 2019-02-24 LAB — COMPREHENSIVE METABOLIC PANEL
ALT: 75 U/L — ABNORMAL HIGH (ref 0–44)
AST: 92 U/L — ABNORMAL HIGH (ref 15–41)
Albumin: 3.3 g/dL — ABNORMAL LOW (ref 3.5–5.0)
Alkaline Phosphatase: 36 U/L — ABNORMAL LOW (ref 38–126)
Anion gap: 8 (ref 5–15)
BUN: 15 mg/dL (ref 6–20)
CO2: 28 mmol/L (ref 22–32)
Calcium: 8.3 mg/dL — ABNORMAL LOW (ref 8.9–10.3)
Chloride: 106 mmol/L (ref 98–111)
Creatinine, Ser: 0.97 mg/dL (ref 0.61–1.24)
GFR calc Af Amer: >60 mL/min (ref >60)
GFR calc non Af Amer: >60 mL/min (ref >60)
Glucose, Bld: 105 mg/dL — ABNORMAL HIGH (ref 70–99)
Potassium: 4 mmol/L (ref 3.5–5.1)
Sodium: 142 mmol/L (ref 135–145)
Total Bilirubin: 0.6 mg/dL (ref 0.3–1.2)
Total Protein: 6.9 g/dL (ref 6.5–8.1)

## 2019-02-24 LAB — PHOSPHORUS: Phosphorus: 3.6 mg/dL (ref 2.5–4.6)

## 2019-02-24 LAB — FERRITIN: Ferritin: 661 ng/mL — ABNORMAL HIGH (ref 24–336)

## 2019-02-24 LAB — C-REACTIVE PROTEIN: CRP: 6.2 mg/dL — ABNORMAL HIGH (ref <1.0)

## 2019-02-24 LAB — D-DIMER, QUANTITATIVE: D-Dimer, Quant: 0.92 ug/mL-FEU — ABNORMAL HIGH (ref 0.00–0.50)

## 2019-02-24 LAB — MAGNESIUM: Magnesium: 2.4 mg/dL (ref 1.7–2.4)

## 2019-02-24 MED ORDER — FOLIC ACID 1 MG PO TABS
1.0000 mg | ORAL_TABLET | Freq: Every day | ORAL | Status: DC
  Administered 2019-02-24 – 2019-02-26 (×3): 1 mg via ORAL
  Filled 2019-02-24 (×3): qty 1

## 2019-02-24 MED ORDER — THIAMINE HCL 100 MG PO TABS
100.0000 mg | ORAL_TABLET | Freq: Every day | ORAL | Status: DC
  Administered 2019-02-24 – 2019-02-26 (×3): 100 mg via ORAL
  Filled 2019-02-24 (×3): qty 1

## 2019-02-24 MED ORDER — ALBUTEROL SULFATE HFA 108 (90 BASE) MCG/ACT IN AERS
1.0000 | INHALATION_SPRAY | RESPIRATORY_TRACT | Status: DC | PRN
  Filled 2019-02-24: qty 6.7

## 2019-02-24 MED ORDER — LORAZEPAM 1 MG PO TABS: 1.0000 mg | ORAL_TABLET | ORAL | Status: DC | PRN

## 2019-02-24 MED ORDER — HYDROCOD POLST-CPM POLST ER 10-8 MG/5ML PO SUER
5.0000 mL | Freq: Two times a day (BID) | ORAL | Status: DC
  Administered 2019-02-24 – 2019-02-26 (×5): 5 mL via ORAL
  Filled 2019-02-24 (×5): qty 5

## 2019-02-24 NOTE — Progress Notes (Signed)
PROGRESS NOTE    Cody Koch  QQV:[id number] DOB: 11-14-1980 DOA: 02/22/2019 PCP: System, Pcp Not In    Brief Narrative:  Patient was admitted to the hospital with a working diagnosis of acute hypoxic respiratory failure due to SARS COVID-19 viral pneumonia  39 year old male who presented with fatigue, myalgias and diarrhea.  He does have significant past medical history of alcohol abuse.  Patient has been quarantining himself at home since February 7, due to dyspnea and a positive COVID-19 02/17/19.  He presented to the hospital due to worsening symptoms.  On his initial physical examination his blood pressure was 131/66, pulse rate 83, temperature 100.4 F, respiratory rate 28 and oxygen saturation 93%.  His lungs were clear to auscultation bilaterally, heart S1-S2 present rhythm, soft abdomen no lower extremity edema. His chest radiograph had multiple focal interstitial infiltrates bilaterally.  EKG 88 bpm, rightward axis, normal intervals, sinus rhythm, no significant ST segment or T wave changes.  Patient received submental oxygen per nasal cannula, systemic steroids and remdesivir.   Assessment & Plan:   Active Problems:   Pneumonia due to COVID-19 virus   Hyponatremia   Hypokalemia   Respiratory failure with hypoxia (HCC)   Gastroenteritis due to COVID-19 virus   ETOH abuse   1. Acute hypoxic respiratory failure due to SARS COVID 19  Viral pneumonia.   RR: 24  Pulse oxymetry: 93 Fi02: 2 L/ min per [place]  COVID-19 Labs  Recent Labs    02/22/19 1101 02/22/19 1101 02/22/19 1855 02/23/19 0405 02/24/19 0310  DDIMER  --   --  1.25* 1.17* 0.92*  FERRITIN 769*   < > 738* 650* 661*  LDH 442*  --   --   --   --   CRP 12.9*   < > 13.5* 11.3* 6.2*   < > = values in this interval not displayed.    No results found for: SARSCOV2NAA  Patient continue to have elevated inflammatory markers.   Continue with medical therapy with Remdesivir # 3/5 (AST 92, ALT 75), systemic  corticosteroids with dexamethasone 6 mg IV q 24H. Antitussive agents, bronchodilators and airway clearing techniques with flutter valve and incentive spirometer.   2. Abnormal EKG on admission.  EKG with no signs of acute ischemia, T wave inversion only on 2 out of 3 inferior leads, personally reviewed, follow up echocardiogram with preserved LV systolic function and no wall motion abnormalities. Acute coronary syndrome ruled out. Will discontinue telemetry for now.   3. Hx of alcohols abuse. No signs of acute withdrawal, will continue neuro checks per unit protocol. Continue with as needed lorazepam for anxiety.   4. Hyponatremia and hypokalemia. Electrolytes corrected with Na at 142 and K 4,0. Renal function preserved with serum cr at 0,97.   DVT prophylaxis: enoxaparin   Code Status: full Family Communication: no family at the bedside  Disposition Plan/ discharge barriers: transfer to medical ward. Will plan to complete antiviral therapy in the hospital.     Subjective: Patient continue to have dyspnea and cough, no chest pain, no nausea or vomiting, out of bed to chair.   Objective: Vitals:   02/24/19 0021 02/24/19 0345 02/24/19 0500 02/24/19 0745  BP: 111/60 104/67  103/64  Pulse: 72 73  74  Resp: (!) 23 20  (!) 24  Temp: (!) 97.5 F (36.4 C)   (!) 97.4 F (36.3 C)  TempSrc: Oral   Oral  SpO2: 97% 93%  93%  Weight:   128.6 kg  Height:        Intake/Output Summary (Last 24 hours) at 02/24/2019 0842 Last data filed at 02/24/2019 0746 Gross per 24 hour  Intake 2620 ml  Output 2875 ml  Net -255 ml   Filed Weights   02/22/19 1819 02/23/19 0423 02/24/19 0500  Weight: 127 kg 128.6 kg 128.6 kg    Examination:   General: Not in pain or dyspnea. Deconditioned  Neurology: Awake and alert, non focal  E ENT: mild pallor, no icterus, oral mucosa moist Cardiovascular: No JVD. S1-S2 present, rhythmic. No lower extremity edema. Pulmonary: positive breath sounds  bilaterally. Gastrointestinal. Abdomen with no organomegaly, non tender, no rebound or guarding Skin. No rashes Musculoskeletal: no joint deformities     Data Reviewed: I have personally reviewed following labs and imaging studies  CBC: Recent Labs  Lab 02/22/19 1101 02/22/19 1435 02/22/19 1855 02/23/19 0405 02/24/19 0310  WBC 6.5 5.2 3.8* 3.8* 5.2  NEUTROABS 5.5  --  3.2 2.7 3.7  HGB 14.7 13.5 13.8 12.7* 13.3  HCT 41.6 38.5* 39.2 37.3* 40.1  MCV 91.2 92.3 92.5 94.2 95.7  PLT 106* 96* 107* 99* 168   Basic Metabolic Panel: Recent Labs  Lab 02/22/19 1101 02/22/19 1435 02/22/19 1855 02/23/19 0405 02/24/19 0310  NA 129*  --  133* 135 142  K 3.3*  --  3.8 4.0 4.0  CL 95*  --  101 103 106  CO2 21*  --  23 24 28   GLUCOSE 101*  --  133* 105* 105*  BUN 13  --  11 12 15   CREATININE 1.14 1.09 1.05 1.05 0.97  CALCIUM 7.9*  --  8.0* 7.8* 8.3*  MG  --  2.5* 2.3 2.3 2.4  PHOS  --   --  2.7 3.1 3.6   GFR: Estimated Creatinine Clearance: 148 mL/min (by C-G formula based on SCr of 0.97 mg/dL). Liver Function Tests: Recent Labs  Lab 02/22/19 1101 02/22/19 1855 02/23/19 0405 02/24/19 0310  AST 75* 77* 76* 92*  ALT 43 47* 48* 75*  ALKPHOS 39 36* 33* 36*  BILITOT 0.8 0.4 0.7 0.6  PROT 7.7 6.9 6.6 6.9  ALBUMIN 3.7 3.4* 3.0* 3.3*   No results for input(s): LIPASE, AMYLASE in the last 168 hours. No results for input(s): AMMONIA in the last 168 hours. Coagulation Profile: No results for input(s): INR, PROTIME in the last 168 hours. Cardiac Enzymes: No results for input(s): CKTOTAL, CKMB, CKMBINDEX, TROPONINI in the last 168 hours. BNP (last 3 results) No results for input(s): PROBNP in the last 8760 hours. HbA1C: No results for input(s): HGBA1C in the last 72 hours. CBG: No results for input(s): GLUCAP in the last 168 hours. Lipid Profile: Recent Labs    02/22/19 1101  TRIG 112   Thyroid Function Tests: No results for input(s): TSH, T4TOTAL, FREET4, T3FREE,  THYROIDAB in the last 72 hours. Anemia Panel: Recent Labs    02/23/19 0405 02/24/19 0310  FERRITIN 650* 661*      Radiology Studies: I have reviewed all of the imaging during this hospital visit personally     Scheduled Meds: . vitamin C  500 mg Oral Daily  . dexamethasone (DECADRON) injection  6 mg Intravenous Q24H  . enoxaparin (LOVENOX) injection  60 mg Subcutaneous Q24H  . folic acid  1 mg Intravenous Daily  . Ipratropium-Albuterol  1 puff Inhalation QID  . thiamine injection  100 mg Intravenous Daily  . zinc sulfate  220 mg Oral Daily   Continuous Infusions: .  sodium chloride 100 mL/hr at 02/24/19 0300  . remdesivir 100 mg in NS 100 mL Stopped (02/23/19 1009)     LOS: 2 days        Edda Orea Gerome Apley, MD

## 2019-02-25 LAB — RESPIRATORY PANEL BY PCR

## 2019-02-25 LAB — COMPREHENSIVE METABOLIC PANEL
ALT: 136 U/L — ABNORMAL HIGH (ref 0–44)
AST: 112 U/L — ABNORMAL HIGH (ref 15–41)
Albumin: 3 g/dL — ABNORMAL LOW (ref 3.5–5.0)
Alkaline Phosphatase: 37 U/L — ABNORMAL LOW (ref 38–126)
Anion gap: 7 (ref 5–15)
BUN: 16 mg/dL (ref 6–20)
CO2: 27 mmol/L (ref 22–32)
Calcium: 8.3 mg/dL — ABNORMAL LOW (ref 8.9–10.3)
Chloride: 103 mmol/L (ref 98–111)
Creatinine, Ser: 0.93 mg/dL (ref 0.61–1.24)
GFR calc Af Amer: >60 mL/min (ref >60)
GFR calc non Af Amer: >60 mL/min (ref >60)
Glucose, Bld: 93 mg/dL (ref 70–99)
Potassium: 3.9 mmol/L (ref 3.5–5.1)
Sodium: 137 mmol/L (ref 135–145)
Total Bilirubin: 0.6 mg/dL (ref 0.3–1.2)
Total Protein: 6.5 g/dL (ref 6.5–8.1)

## 2019-02-25 LAB — C-REACTIVE PROTEIN: CRP: 3.8 mg/dL — ABNORMAL HIGH (ref <1.0)

## 2019-02-25 LAB — D-DIMER, QUANTITATIVE: D-Dimer, Quant: 1.05 ug/mL-FEU — ABNORMAL HIGH (ref 0.00–0.50)

## 2019-02-25 LAB — FERRITIN: Ferritin: 633 ng/mL — ABNORMAL HIGH (ref 24–336)

## 2019-02-25 LAB — SARS CORONAVIRUS 2 (TAT 6-24 HRS): SARS Coronavirus 2: POSITIVE — AB

## 2019-02-25 MED ORDER — ENOXAPARIN SODIUM 60 MG/0.6ML ~~LOC~~ SOLN
60.0000 mg | SUBCUTANEOUS | Status: DC
  Administered 2019-02-25: 60 mg via SUBCUTANEOUS
  Filled 2019-02-25: qty 0.6

## 2019-02-25 MED ORDER — LIP MEDEX EX OINT
1.0000 "application " | TOPICAL_OINTMENT | CUTANEOUS | Status: DC | PRN
  Filled 2019-02-25: qty 7

## 2019-02-25 NOTE — Evaluation (Signed)
Physical Therapy Evaluation Patient Details Name: TIMON GEISSINGER MRN: [id number] DOB: 1980-07-16 Today's Date: 02/25/2019   History of Present Illness  39 year old male who presented with fatigue, myalgias and diarrhea.  He does have significant past medical history of alcohol abuse.  Patient has been quarantining himself at home since February 7, due to dyspnea and a positive COVID-19 02/17/19.  He presented to the hospital due to worsening symptoms.    Clinical Impression   Pt admitted with above hx and above dx. This am pt is quite independent he has been ambulating around room on his own and completing "air squats". Initially pt was on 2L/min via [place] extended x2, pt was able to ambulate approx 579ft with no AD and mod I and min desat noted was 87%. Pt reports has been bathing himself while here and was observed donning pants and shoes in standing with no loss of balance. At this time no further skilled PT tx is indicated, pt has been educated on importance of continued mobility and also continued breathing exercises. Nurse has also been appraised on need to encourage pt to ambulate on unit while here. At dc pt will not require further PT f/u.     Follow Up Recommendations No PT follow up    Equipment Recommendations  None recommended by PT    Recommendations for Other Services       Precautions / Restrictions Precautions Precautions: None Restrictions Weight Bearing Restrictions: No      Mobility  Bed Mobility Overal bed mobility: Independent                Transfers Overall transfer level: Modified independent Equipment used: None                Ambulation/Gait Ambulation/Gait assistance: Modified independent (Device/Increase time) Gait Distance (Feet): 500 Feet Assistive device: None Gait Pattern/deviations: WFL(Within Functional Limits) Gait velocity: fair, some standing rest breaks   General Gait Details: able to ambulate around room on his own, ambulated  in hall with mod I on room air min desat to 87%  Stairs            Wheelchair Mobility    Modified Rankin (Stroke Patients Only)       Balance Overall balance assessment: Modified Independent                                           Pertinent Vitals/Pain Pain Assessment: No/denies pain    Home Living Family/patient expects to be discharged to:: Private residence Living Arrangements: Parent;Other (Comment) Available Help at Discharge: Family Type of Home: Apartment Home Access: Stairs to enter Entrance Stairs-Rails: Left Entrance Stairs-Number of Steps: 10-15 Home Layout: One level Home Equipment: Other (comment)(hiking stick)      Prior Function Level of Independence: Independent               Hand Dominance        Extremity/Trunk Assessment   Upper Extremity Assessment Upper Extremity Assessment: Overall WFL for tasks assessed    Lower Extremity Assessment Lower Extremity Assessment: Overall WFL for tasks assessed    Cervical / Trunk Assessment Cervical / Trunk Assessment: Normal  Communication   Communication: No difficulties  Cognition Arousal/Alertness: Awake/alert Behavior During Therapy: WFL for tasks assessed/performed Overall Cognitive Status: Within Functional Limits for tasks assessed  General Comments      Exercises     Assessment/Plan    PT Assessment Patent does not need any further PT services  PT Problem List         PT Treatment Interventions      PT Goals (Current goals can be found in the Care Plan section)  Acute Rehab PT Goals Patient Stated Goal: to get home PT Goal Formulation: With patient    Frequency     Barriers to discharge        Co-evaluation               AM-PAC PT "6 Clicks" Mobility  Outcome Measure Help needed turning from your back to your side while in a flat bed without using bedrails?: None Help needed  moving from lying on your back to sitting on the side of a flat bed without using bedrails?: None Help needed moving to and from a bed to a chair (including a wheelchair)?: None Help needed standing up from a chair using your arms (e.g., wheelchair or bedside chair)?: None Help needed to walk in hospital room?: None Help needed climbing 3-5 steps with a railing? : None 6 Click Score: 24    End of Session   Activity Tolerance: Patient tolerated treatment well;Patient limited by fatigue Patient left: in chair;with call bell/phone within reach Nurse Communication: Mobility status      Time: 4656-8127 PT Time Calculation (min) (ACUTE ONLY): 16 min   Charges:   PT Evaluation $PT Eval Low Complexity: 1 Low          Drema Pry, PT   Freddi Starr 02/25/2019, 12:57 PM

## 2019-02-25 NOTE — Progress Notes (Signed)
PROGRESS NOTE    Cody Koch  WIO:[id number] DOB: August 19, 1980 DOA: 02/22/2019 PCP: System, Pcp Not In    Brief Narrative:  Patient was admitted to the hospital with a working diagnosis of acute hypoxic respiratory failure due to SARS COVID-19 viral pneumonia  39 year old male who presented with fatigue, myalgias and diarrhea.  He does have significant past medical history of alcohol abuse.  Patient has been quarantining himself at home since February 7, due to dyspnea and a positive COVID-19 02/17/19.  He presented to the hospital due to worsening symptoms.  On his initial physical examination his blood pressure was 131/66, pulse rate 83, temperature 100.4 F, respiratory rate 28 and oxygen saturation 93%.  His lungs were clear to auscultation bilaterally, heart S1-S2 present rhythm, soft abdomen no lower extremity edema. His chest radiograph had multiple focal interstitial infiltrates bilaterally.  EKG 88 bpm, rightward axis, normal intervals, sinus rhythm, no significant ST segment or T wave changes.  Patient received submental oxygen per nasal cannula, systemic steroids and remdesivir  Patient has been responding well to medical therapy.    Assessment & Plan:   Principal Problem:   Pneumonia due to COVID-19 virus Active Problems:   Hyponatremia   Hypokalemia   Respiratory failure with hypoxia (HCC)   Gastroenteritis due to COVID-19 virus   ETOH abuse    1. Acute hypoxic respiratory failure due to SARS COVID 19  Viral pneumonia.   RR: 20  Pulse oxymetry: 93%  Fi02: 2 L/ min per [place]  COVID-19 Labs  Recent Labs    02/22/19 1101 02/22/19 1855 02/23/19 0405 02/24/19 0310 02/25/19 0320  DDIMER  --    < > 1.17* 0.92* 1.05*  FERRITIN 769*   < > 650* 661* 633*  LDH 442*  --   --   --   --   CRP 12.9*   < > 11.3* 6.2* 3.8*   < > = values in this interval not displayed.    No results found for: SARSCOV2NAA  Inflammatory markers are trending down and stable oxygen  requirements.   Tolerating well medical therapy with Remdesivir # 4/5 (AST 112, ALT 136), systemic corticosteroids with dexamethasone 6 mg IV q 24H. Continue with antitussive agents, bronchodilators and airway clearing techniques with flutter valve and incentive spirometer.   Out of bed to chair tid with meals, physical and occupational therapy.   2. Abnormal EKG on admission.  EKG with no signs of acute ischemia, T wave inversion only on 2 out of 3 inferior leads, personally reviewed, follow up echocardiogram with preserved LV systolic function and no wall motion abnormalities. Acute coronary syndrome ruled out.   3. Hx of alcohols abuse. Continue with no signs of acute withdrawal. On as needed lorazepam, has not required any dosage.   4. Hyponatremia and hypokalemia. Na and K have been corrected. Patient is tolerating po well.   DVT prophylaxis: enoxaparin   Code Status: full Family Communication: no family at the bedside  Disposition Plan/ discharge barriers: Plan for dc in am, after completing remdesivir therapy.     Subjective: Dyspnea and cough continue to improve but not yet back to baseline, no nausea or vomiting. No chest pain. Has been out of bed to chair.   Objective: Vitals:   02/24/19 1651 02/24/19 1923 02/24/19 2000 02/25/19 0330  BP: 126/80 133/70  122/65  Pulse: (!) 105 77 79 62  Resp: (!) 24 18 16 20   Temp: 97.7 F (36.5 C) 99.2 F (37.3 C)  (!)  97.4 F (36.3 C)  TempSrc: Oral Oral  Oral  SpO2: 93% 94% 92% 93%  Weight:      Height:        Intake/Output Summary (Last 24 hours) at 02/25/2019 0834 Last data filed at 02/24/2019 1859 Gross per 24 hour  Intake 820 ml  Output --  Net 820 ml   Filed Weights   02/22/19 1819 02/23/19 0423 02/24/19 0500  Weight: 127 kg 128.6 kg 128.6 kg    Examination:   General: Not in pain or dyspnea, deconditioned  Neurology: Awake and alert, non focal  E ENT: no pallor, no icterus, oral mucosa moist Cardiovascular:  No JVD. S1-S2 present, rhythmic. No lower extremity edema. Pulmonary: vesicular breath sounds bilaterally. Gastrointestinal. Abdomen with no organomegaly, non tender, no rebound or guarding Skin. No rashes Musculoskeletal: no joint deformities     Data Reviewed: I have personally reviewed following labs and imaging studies  CBC: Recent Labs  Lab 02/22/19 1101 02/22/19 1435 02/22/19 1855 02/23/19 0405 02/24/19 0310  WBC 6.5 5.2 3.8* 3.8* 5.2  NEUTROABS 5.5  --  3.2 2.7 3.7  HGB 14.7 13.5 13.8 12.7* 13.3  HCT 41.6 38.5* 39.2 37.3* 40.1  MCV 91.2 92.3 92.5 94.2 95.7  PLT 106* 96* 107* 99* 168   Basic Metabolic Panel: Recent Labs  Lab 02/22/19 1101 02/22/19 1101 02/22/19 1435 02/22/19 1855 02/23/19 0405 02/24/19 0310 02/25/19 0320  NA 129*  --   --  133* 135 142 137  K 3.3*  --   --  3.8 4.0 4.0 3.9  CL 95*  --   --  101 103 106 103  CO2 21*  --   --  23 24 28 27   GLUCOSE 101*  --   --  133* 105* 105* 93  BUN 13  --   --  11 12 15 16   CREATININE 1.14   < > 1.09 1.05 1.05 0.97 0.93  CALCIUM 7.9*  --   --  8.0* 7.8* 8.3* 8.3*  MG  --   --  2.5* 2.3 2.3 2.4  --   PHOS  --   --   --  2.7 3.1 3.6  --    < > = values in this interval not displayed.   GFR: Estimated Creatinine Clearance: 154.4 mL/min (by C-G formula based on SCr of 0.93 mg/dL). Liver Function Tests: Recent Labs  Lab 02/22/19 1101 02/22/19 1855 02/23/19 0405 02/24/19 0310 02/25/19 0320  AST 75* 77* 76* 92* 112*  ALT 43 47* 48* 75* 136*  ALKPHOS 39 36* 33* 36* 37*  BILITOT 0.8 0.4 0.7 0.6 0.6  PROT 7.7 6.9 6.6 6.9 6.5  ALBUMIN 3.7 3.4* 3.0* 3.3* 3.0*   No results for input(s): LIPASE, AMYLASE in the last 168 hours. No results for input(s): AMMONIA in the last 168 hours. Coagulation Profile: No results for input(s): INR, PROTIME in the last 168 hours. Cardiac Enzymes: No results for input(s): CKTOTAL, CKMB, CKMBINDEX, TROPONINI in the last 168 hours. BNP (last 3 results) No results for  input(s): PROBNP in the last 8760 hours. HbA1C: No results for input(s): HGBA1C in the last 72 hours. CBG: No results for input(s): GLUCAP in the last 168 hours. Lipid Profile: Recent Labs    02/22/19 1101  TRIG 112   Thyroid Function Tests: No results for input(s): TSH, T4TOTAL, FREET4, T3FREE, THYROIDAB in the last 72 hours. Anemia Panel: Recent Labs    02/24/19 0310 02/25/19 0320  FERRITIN 661* 633*  Radiology Studies: I have reviewed all of the imaging during this hospital visit personally     Scheduled Meds: . vitamin C  500 mg Oral Daily  . chlorpheniramine-HYDROcodone  5 mL Oral Q12H  . dexamethasone (DECADRON) injection  6 mg Intravenous Q24H  . folic acid  1 mg Oral Daily  . Ipratropium-Albuterol  1 puff Inhalation QID  . thiamine  100 mg Oral Daily  . zinc sulfate  220 mg Oral Daily   Continuous Infusions: . remdesivir 100 mg in NS 100 mL 100 mg (02/24/19 1052)     LOS: 3 days        Siegfried Vieth Gerome Apley, MD

## 2019-02-26 LAB — FERRITIN: Ferritin: 653 ng/mL — ABNORMAL HIGH (ref 24–336)

## 2019-02-26 LAB — COMPREHENSIVE METABOLIC PANEL
ALT: 171 U/L — ABNORMAL HIGH (ref 0–44)
AST: 102 U/L — ABNORMAL HIGH (ref 15–41)
Albumin: 3.1 g/dL — ABNORMAL LOW (ref 3.5–5.0)
Alkaline Phosphatase: 41 U/L (ref 38–126)
Anion gap: 9 (ref 5–15)
BUN: 17 mg/dL (ref 6–20)
CO2: 26 mmol/L (ref 22–32)
Calcium: 8.7 mg/dL — ABNORMAL LOW (ref 8.9–10.3)
Chloride: 103 mmol/L (ref 98–111)
Creatinine, Ser: 0.84 mg/dL (ref 0.61–1.24)
GFR calc Af Amer: >60 mL/min (ref >60)
GFR calc non Af Amer: >60 mL/min (ref >60)
Glucose, Bld: 99 mg/dL (ref 70–99)
Potassium: 4.1 mmol/L (ref 3.5–5.1)
Sodium: 138 mmol/L (ref 135–145)
Total Bilirubin: 0.8 mg/dL (ref 0.3–1.2)
Total Protein: 6.6 g/dL (ref 6.5–8.1)

## 2019-02-26 LAB — C-REACTIVE PROTEIN: CRP: 2.5 mg/dL — ABNORMAL HIGH (ref <1.0)

## 2019-02-26 LAB — D-DIMER, QUANTITATIVE: D-Dimer, Quant: 1.11 ug/mL-FEU — ABNORMAL HIGH (ref 0.00–0.50)

## 2019-02-26 MED ORDER — GUAIFENESIN-DM 100-10 MG/5ML PO SYRP: 10.0000 mL | ORAL_SOLUTION | Freq: Four times a day (QID) | ORAL | 0 refills | Status: DC | PRN

## 2019-02-26 MED ORDER — ALBUTEROL SULFATE HFA 108 (90 BASE) MCG/ACT IN AERS: 1.0000 | INHALATION_SPRAY | Freq: Four times a day (QID) | RESPIRATORY_TRACT | 0 refills | Status: AC | PRN

## 2019-02-26 NOTE — Discharge Summary (Addendum)
Physician Discharge Summary  Cody Koch OVZ:[id number] DOB: 09-07-1980 DOA: 02/22/2019  PCP: System, Pcp Not In  Admit date: 02/22/2019 Discharge date: 02/26/2019  Admitted From: Home  Disposition:  Home   Recommendations for Outpatient Follow-up and new medication changes:  1. Follow up with Primary Care in 2 weeks.  2. Continue self quarantine for 2 more weeks, use a mask in public and maintain physical distancing.   Home Health: no   Equipment/Devices: no    Discharge Condition: stable  CODE STATUS: full  Diet recommendation: regular.   Brief/Interim Summary: Patient was admitted to the hospital with a working diagnosis of acute hypoxic respiratory failure due to SARS COVID-19 viral pneumonia  39 year old male who presented with fatigue, myalgias and diarrhea. He does not have significant past medical history. Patient has been quarantining himself at home since February 7, due to dyspnea and a positive COVID-19 on 02/17/19. He presented to the hospital due to worsening symptoms. On his initial physical examination his blood pressure was 131/66, pulse rate 83, temperature 100.4 F, respiratory rate 28 and oxygen saturation 93%.His lungs were clear to auscultation bilaterally, heart S1-S2 present rhythm, soft abdomen no lower extremity edema. Sodium 133, potassium 3.8, chloride 101, bicarb 23, glucose 133, BUN 11, creatinine 1.0, white count 3.8, hemoglobin 13.8, hematocrit 39.2, platelets 107. His chest radiograph had multiple focal interstitial infiltrates bilaterally. EKG 88 bpm, rightward axis, normal intervals, sinus rhythm, no significant ST segment or T wave changes.  Patient received submental oxygen per nasal cannula, systemic steroids and remdesivir  Patient responded well to medical therapy  1.  Acute hypoxic respiratory failure due to SARS COVID-19 viral pneumonia.  Patient was admitted to the medical ward, he received supplemental oxygen per nasal cannula, medical  therapy with remdesivir and dexamethasone.  He was treated with antitussive agents, bronchodilators and airway clearing techniques with incentive spirometry and flutter valve.  Patient symptoms, oxygen requirements and inflammatory markers improved.  COVID-19 Labs  Recent Labs    02/24/19 0310 02/25/19 0320 02/26/19 0228  DDIMER 0.92* 1.05* 1.11*  FERRITIN 661* 633* 653*  CRP 6.2* 3.8* 2.5*    Lab Results  Component Value Date   SARSCOV2NAA POSITIVE (A) 02/24/2019   At discharge his oximetry was 99% on room air.  2.  Hyponatremia/hypokalemia.  Patient received supportive medical therapy, his electrolytes corrected, discharge sodium 138, potassium 4.1, serum bicarb 26, kidney function remained stable with a discharge creatinine 0.84.  3.  Reactive thrombocytopenia. Nadir platelet count 99, at discharge is 168.  4.  Abnormal EKG.  Further work-up with echocardiography, showed normal LV function with no wall motion normalities.  Acute coronary syndrome was ruled out.  Discharge Diagnoses:  Principal Problem:   Pneumonia due to COVID-19 virus Active Problems:   Hyponatremia   Hypokalemia   Respiratory failure with hypoxia (HCC)   Gastroenteritis due to COVID-19 virus    Discharge Instructions   Allergies as of 02/26/2019      Reactions   Ceclor [cefaclor] Anaphylaxis, Hives      Medication List    TAKE these medications   AIRBORNE PO Take 1 tablet by mouth as directed.   albuterol 108 (90 Base) MCG/ACT inhaler Commonly known as: VENTOLIN HFA Inhale 1 puff into the lungs every 6 (six) hours as needed for wheezing or shortness of breath.   cholecalciferol 25 MCG (1000 UNIT) tablet Commonly known as: VITAMIN D3 Take 1,000 Units by mouth daily.   guaiFENesin-dextromethorphan 100-10 MG/5ML syrup Commonly known as:  ROBITUSSIN DM Take 10 mLs by mouth every 6 (six) hours as needed for cough.   milk thistle 175 MG tablet Take 175 mg by mouth as directed.    multivitamin with minerals Tabs tablet Take 1 tablet by mouth daily.   Turmeric 500 MG Caps Take 500 mg by mouth as directed.   vitamin B-12 1000 MCG tablet Commonly known as: CYANOCOBALAMIN Take 1,000 mcg by mouth daily.       Allergies  Allergen Reactions  . Ceclor [Cefaclor] Anaphylaxis and Hives    Consultations:     Procedures/Studies: DG Chest Port 1 View  Result Date: 02/22/2019 CLINICAL DATA:  Fever and shortness of breath. EXAM: PORTABLE CHEST 1 VIEW COMPARISON:  None. FINDINGS: The cardiac silhouette, mediastinal and hilar contours are within normal limits. Multifocal patchy lung infiltrates suggesting atypical/viral pneumonia such as COVID pneumonia. Recommend clinical correlation. No pleural effusions. The bony thorax is intact. IMPRESSION: Diffuse lung infiltrates. Electronically Signed   By: Rudie MeyerP.  Gallerani M.D.   On: 02/22/2019 11:33   ECHOCARDIOGRAM COMPLETE  Result Date: 02/23/2019    ECHOCARDIOGRAM REPORT   Patient Name:   Cody Koch Date of Exam: 02/23/2019 Medical Rec #:  [id number]    Height:       73.0 in Accession #:    [id number]   Weight:       283.5 lb Date of Birth:  1980/03/06    BSA:          2.50 m Patient Age:    38 years     BP:           121/76 mmHg Patient Gender: M            HR:           90 bpm. Exam Location:  Inpatient Procedure: 2D Echo, Color Doppler and Cardiac Doppler Indications:    dyspnea  History:        Patient has no prior history of Echocardiogram examinations.                 ETOH.  Sonographer:    Leta Junglingiffany Cooper RDCS Referring Phys: [id number] CURTIS J WOODS IMPRESSIONS  1. Left ventricular ejection fraction, by estimation, is 55 to 60%. The left ventricle has normal function. The left ventrical has no regional wall motion abnormalities. Left ventricular diastolic parameters were normal.  2. Right ventricular systolic function is normal. The right ventricular size is normal. Tricuspid regurgitation signal is inadequate for assessing PA  pressure.  3. Trivial mitral valve regurgitation.  4. The aortic valve is normal in structure and function. Aortic valve regurgitation is not visualized. No aortic stenosis is present. FINDINGS  Left Ventricle: Left ventricular ejection fraction, by estimation, is 55 to 60%. The left ventricle has normal function. The left ventricle has no regional wall motion abnormalities. The left ventricular internal cavity size was normal in size. There is  no left ventricular hypertrophy. Normal left ventricular filling pressure. Right Ventricle: The right ventricular size is normal. No increase in right ventricular wall thickness. Right ventricular systolic function is normal. Tricuspid regurgitation signal is inadequate for assessing PA pressure. Left Atrium: Left atrial size was normal in size. Right Atrium: Right atrial size was normal in size. Pericardium: There is no evidence of pericardial effusion. Mitral Valve: The mitral valve is normal in structure and function. Normal mobility of the mitral valve leaflets. Trivial mitral valve regurgitation. No evidence of mitral valve stenosis. Tricuspid Valve: The tricuspid valve is normal  in structure. Tricuspid valve regurgitation is not demonstrated. No evidence of tricuspid stenosis. Aortic Valve: The aortic valve is normal in structure and function. Aortic valve regurgitation is not visualized. No aortic stenosis is present. Pulmonic Valve: The pulmonic valve was normal in structure. Pulmonic valve regurgitation is trivial. No evidence of pulmonic stenosis. Aorta: The aortic root is normal in size and structure. Venous: The inferior vena cava was not well visualized. The inferior vena cava and the hepatic vein show a normal flow pattern. IAS/Shunts: No atrial level shunt detected by color flow Doppler.  LEFT VENTRICLE PLAX 2D LVIDd:         4.98 cm  Diastology LVIDs:         3.42 cm  LV e' lateral:   13.90 cm/s LV PW:         0.99 cm  LV E/e' lateral: 4.8 LV IVS:        0.97  cm  LV e' medial:    10.00 cm/s LVOT diam:     2.10 cm  LV E/e' medial:  6.6 LV SV:         62.69 ml LV SV Index:   26.34 LVOT Area:     3.46 cm  RIGHT VENTRICLE RV S prime:     12.20 cm/s TAPSE (M-mode): 2.1 cm LEFT ATRIUM             Index       RIGHT ATRIUM           Index LA diam:        3.70 cm 1.48 cm/m  RA Area:     17.00 cm LA Vol (A2C):   53.9 ml 21.60 ml/m RA Volume:   49.30 ml  19.75 ml/m LA Vol (A4C):   56.5 ml 22.64 ml/m LA Biplane Vol: 58.6 ml 23.48 ml/m  AORTIC VALVE LVOT Vmax:   88.70 cm/s LVOT Vmean:  71.500 cm/s LVOT VTI:    0.181 m  AORTA Ao Root diam: 2.90 cm MITRAL VALVE MV Area (PHT): 4.06 cm             SHUNTS MV Decel Time: 187 msec             Systemic VTI:  0.18 m MV E velocity: 66.50 cm/s 103 cm/s  Systemic Diam: 2.10 cm MV A velocity: 50.40 cm/s 70.3 cm/s MV E/A ratio:  1.32       1.5 Armanda Magic MD Electronically signed by Armanda Magic MD Signature Date/Time: 02/23/2019/10:42:07 AM    Final       Procedures:   Subjective: Patient is feeling better, dyspnea has been improving, no chest pain, no nausea or vomiting.   Discharge Exam: Vitals:   02/26/19 0000 02/26/19 0530  BP:  92/63  Pulse:  (!) 55  Resp:  16  Temp:  98.1 F (36.7 C)  SpO2: 98% 99%   Vitals:   02/25/19 1145 02/25/19 1633 02/26/19 0000 02/26/19 0530  BP: 119/86 116/70  92/63  Pulse: 84 64  (!) 55  Resp: 16 15  16   Temp: 98.4 F (36.9 C) 98 F (36.7 C)  98.1 F (36.7 C)  TempSrc: Oral Oral  Oral  SpO2: 91% 92% 98% 99%  Weight:      Height:        General: Not in pain or dyspnea.  Neurology: Awake and alert, non focal  E ENT: no pallor, no icterus, oral mucosa moist Cardiovascular: No JVD. S1-S2 present, rhythmic. No lower  extremity edema. Pulmonary: vesicular breath sounds bilaterally. Gastrointestinal. Abdomen with no organomegaly, non tender, no rebound or guarding Skin. No rashes Musculoskeletal: no joint deformities   The results of significant diagnostics from this  hospitalization (including imaging, microbiology, ancillary and laboratory) are listed below for reference.     Microbiology: Recent Results (from the past 240 hour(s))  Blood Culture (routine x 2)     Status: None (Preliminary result)   Collection Time: 02/22/19 11:01 AM   Specimen: BLOOD  Result Value Ref Range Status   Specimen Description BLOOD BLOOD LEFT FOREARM  Final   Special Requests   Final    BOTTLES DRAWN AEROBIC AND ANAEROBIC Blood Culture adequate volume   Culture   Final    NO GROWTH 4 DAYS Performed at [facility], [address] [place], [place] [address]    Report Status PENDING  Incomplete  Blood Culture (routine x 2)     Status: None (Preliminary result)   Collection Time: 02/22/19 11:06 AM   Specimen: BLOOD  Result Value Ref Range Status   Specimen Description BLOOD BLOOD LEFT HAND  Final   Special Requests   Final    BOTTLES DRAWN AEROBIC AND ANAEROBIC Blood Culture adequate volume   Culture   Final    NO GROWTH 4 DAYS Performed at [facility], [address] [place], [place] [address]    Report Status PENDING  Incomplete  Respiratory Panel by PCR     Status: None   Collection Time: 02/24/19  9:53 PM   Specimen: Nasopharyngeal Swab; Respiratory  Result Value Ref Range Status   Adenovirus NOT DETECTED NOT DETECTED Final   Coronavirus 229E NOT DETECTED NOT DETECTED Final    Comment: (NOTE) The Coronavirus on the Respiratory Panel, DOES NOT test for the novel  Coronavirus (2019 nCoV)    Coronavirus HKU1 NOT DETECTED NOT DETECTED Final   Coronavirus NL63 NOT DETECTED NOT DETECTED Final   Coronavirus OC43 NOT DETECTED NOT DETECTED Final   Metapneumovirus NOT DETECTED NOT DETECTED Final   Rhinovirus / Enterovirus NOT DETECTED NOT DETECTED Final   Influenza A NOT DETECTED NOT DETECTED Final   Influenza B NOT DETECTED NOT DETECTED Final   Parainfluenza Virus 1 NOT DETECTED NOT DETECTED Final   Parainfluenza Virus 2 NOT DETECTED NOT  DETECTED Final   Parainfluenza Virus 3 NOT DETECTED NOT DETECTED Final   Parainfluenza Virus 4 NOT DETECTED NOT DETECTED Final   Respiratory Syncytial Virus NOT DETECTED NOT DETECTED Final   Bordetella pertussis NOT DETECTED NOT DETECTED Final   Chlamydophila pneumoniae NOT DETECTED NOT DETECTED Final   Mycoplasma pneumoniae NOT DETECTED NOT DETECTED Final    Comment: Performed at [facility], [address] [place], [place] [address]  SARS CORONAVIRUS 2 (TAT 6-24 HRS) Nasopharyngeal Nasopharyngeal Swab     Status: Abnormal   Collection Time: 02/24/19  9:53 PM   Specimen: Nasopharyngeal Swab  Result Value Ref Range Status   SARS Coronavirus 2 POSITIVE (A) NEGATIVE Final    Comment: RESULT CALLED TO, READ BACK BY AND VERIFIED WITH: Felix AhmadiH. Trudell RN 9:25 02/25/19 (wilsonm) (NOTE) SARS-CoV-2 target nucleic acids are DETECTED. The SARS-CoV-2 RNA is generally detectable in upper and lower respiratory specimens during the acute phase of infection. Positive results are indicative of the presence of SARS-CoV-2 RNA. Clinical correlation with patient history and other diagnostic information is  necessary to determine patient infection status. Positive results do not rule out bacterial infection or co-infection with other  viruses.  The expected result is Negative. Fact Sheet for Patients: HairSlick.no Fact Sheet for Healthcare Providers: quierodirigir.com This test is not yet approved or cleared by the [place] FDA and  has been authorized for detection and/or diagnosis of SARS-CoV-2 by FDA under an Emergency Use Authorization (EUA). This EUA will remain  in effect (meaning this test can be used) for th e duration of the COVID-19 declaration under Section 564(b)(1) of the Act, 21 U.S.C. section 360bbb-3(b)(1), unless the authorization is terminated or revoked sooner. Performed at [facility], [address],  [place], [place] [address]      Labs: BNP (last 3 results) Recent Labs    02/22/19 1101 02/22/19 1855  BNP 19.0 35.1   Basic Metabolic Panel: Recent Labs  Lab 02/22/19 1101 02/22/19 1435 02/22/19 1855 02/23/19 0405 02/24/19 0310 02/25/19 0320 02/26/19 0228  NA   < >  --  133* 135 142 137 138  K   < >  --  3.8 4.0 4.0 3.9 4.1  CL   < >  --  101 103 106 103 103  CO2   < >  --  23 24 28 27 26   GLUCOSE   < >  --  133* 105* 105* 93 99  BUN   < >  --  11 12 15 16 17   CREATININE   < > 1.09 1.05 1.05 0.97 0.93 0.84  CALCIUM   < >  --  8.0* 7.8* 8.3* 8.3* 8.7*  MG  --  2.5* 2.3 2.3 2.4  --   --   PHOS  --   --  2.7 3.1 3.6  --   --    < > = values in this interval not displayed.   Liver Function Tests: Recent Labs  Lab 02/22/19 1855 02/23/19 0405 02/24/19 0310 02/25/19 0320 02/26/19 0228  AST 77* 76* 92* 112* 102*  ALT 47* 48* 75* 136* 171*  ALKPHOS 36* 33* 36* 37* 41  BILITOT 0.4 0.7 0.6 0.6 0.8  PROT 6.9 6.6 6.9 6.5 6.6  ALBUMIN 3.4* 3.0* 3.3* 3.0* 3.1*   No results for input(s): LIPASE, AMYLASE in the last 168 hours. No results for input(s): AMMONIA in the last 168 hours. CBC: Recent Labs  Lab 02/22/19 1101 02/22/19 1435 02/22/19 1855 02/23/19 0405 02/24/19 0310  WBC 6.5 5.2 3.8* 3.8* 5.2  NEUTROABS 5.5  --  3.2 2.7 3.7  HGB 14.7 13.5 13.8 12.7* 13.3  HCT 41.6 38.5* 39.2 37.3* 40.1  MCV 91.2 92.3 92.5 94.2 95.7  PLT 106* 96* 107* 99* 168   Cardiac Enzymes: No results for input(s): CKTOTAL, CKMB, CKMBINDEX, TROPONINI in the last 168 hours. BNP: Invalid input(s): POCBNP CBG: No results for input(s): GLUCAP in the last 168 hours. D-Dimer Recent Labs    02/25/19 0320 02/26/19 0228  DDIMER 1.05* 1.11*   Hgb A1c No results for input(s): HGBA1C in the last 72 hours. Lipid Profile No results for input(s): CHOL, HDL, LDLCALC, TRIG, CHOLHDL, LDLDIRECT in the last 72 hours. Thyroid function studies No results for input(s): TSH, T4TOTAL, T3FREE, THYROIDAB  in the last 72 hours.  Invalid input(s): FREET3 Anemia work up Recent Labs    02/25/19 0320 02/26/19 0228  FERRITIN 633* 653*   Urinalysis    Component Value Date/Time   COLORURINE YELLOW (A) 02/22/2019 1053   APPEARANCEUR HAZY (A) 02/22/2019 1053   LABSPEC 1.016 02/22/2019 1053   PHURINE 5.0 02/22/2019 1053   GLUCOSEU NEGATIVE 02/22/2019 1053  HGBUR NEGATIVE 02/22/2019 1053   BILIRUBINUR NEGATIVE 02/22/2019 1053   KETONESUR 20 (A) 02/22/2019 1053   PROTEINUR 30 (A) 02/22/2019 1053   NITRITE NEGATIVE 02/22/2019 1053   LEUKOCYTESUR NEGATIVE 02/22/2019 1053   Sepsis Labs Invalid input(s): PROCALCITONIN,  WBC,  LACTICIDVEN Microbiology Recent Results (from the past 240 hour(s))  Blood Culture (routine x 2)     Status: None (Preliminary result)   Collection Time: 02/22/19 11:01 AM   Specimen: BLOOD  Result Value Ref Range Status   Specimen Description BLOOD BLOOD LEFT FOREARM  Final   Special Requests   Final    BOTTLES DRAWN AEROBIC AND ANAEROBIC Blood Culture adequate volume   Culture   Final    NO GROWTH 4 DAYS Performed at [facility], [address] [place], [place] [address]    Report Status PENDING  Incomplete  Blood Culture (routine x 2)     Status: None (Preliminary result)   Collection Time: 02/22/19 11:06 AM   Specimen: BLOOD  Result Value Ref Range Status   Specimen Description BLOOD BLOOD LEFT HAND  Final   Special Requests   Final    BOTTLES DRAWN AEROBIC AND ANAEROBIC Blood Culture adequate volume   Culture   Final    NO GROWTH 4 DAYS Performed at [facility], [address] [place], [place] [address]    Report Status PENDING  Incomplete  Respiratory Panel by PCR     Status: None   Collection Time: 02/24/19  9:53 PM   Specimen: Nasopharyngeal Swab; Respiratory  Result Value Ref Range Status   Adenovirus NOT DETECTED NOT DETECTED Final   Coronavirus 229E NOT DETECTED NOT DETECTED Final    Comment: (NOTE) The Coronavirus on  the Respiratory Panel, DOES NOT test for the novel  Coronavirus (2019 nCoV)    Coronavirus HKU1 NOT DETECTED NOT DETECTED Final   Coronavirus NL63 NOT DETECTED NOT DETECTED Final   Coronavirus OC43 NOT DETECTED NOT DETECTED Final   Metapneumovirus NOT DETECTED NOT DETECTED Final   Rhinovirus / Enterovirus NOT DETECTED NOT DETECTED Final   Influenza A NOT DETECTED NOT DETECTED Final   Influenza B NOT DETECTED NOT DETECTED Final   Parainfluenza Virus 1 NOT DETECTED NOT DETECTED Final   Parainfluenza Virus 2 NOT DETECTED NOT DETECTED Final   Parainfluenza Virus 3 NOT DETECTED NOT DETECTED Final   Parainfluenza Virus 4 NOT DETECTED NOT DETECTED Final   Respiratory Syncytial Virus NOT DETECTED NOT DETECTED Final   Bordetella pertussis NOT DETECTED NOT DETECTED Final   Chlamydophila pneumoniae NOT DETECTED NOT DETECTED Final   Mycoplasma pneumoniae NOT DETECTED NOT DETECTED Final    Comment: Performed at [facility], [address], [place], [place] [address]  SARS CORONAVIRUS 2 (TAT 6-24 HRS) Nasopharyngeal Nasopharyngeal Swab     Status: Abnormal   Collection Time: 02/24/19  9:53 PM   Specimen: Nasopharyngeal Swab  Result Value Ref Range Status   SARS Coronavirus 2 POSITIVE (A) NEGATIVE Final    Comment: RESULT CALLED TO, READ BACK BY AND VERIFIED WITH: Felix Ahmadi RN 9:25 02/25/19 (wilsonm) (NOTE) SARS-CoV-2 target nucleic acids are DETECTED. The SARS-CoV-2 RNA is generally detectable in upper and lower respiratory specimens during the acute phase of infection. Positive results are indicative of the presence of SARS-CoV-2 RNA. Clinical correlation with patient history and other diagnostic information is  necessary to determine patient infection status. Positive results do not rule out bacterial infection or co-infection with other viruses.  The expected  result is Negative. Fact Sheet for Patients: SugarRoll.be Fact Sheet for Healthcare  Providers: https://www.woods-mathews.com/ This test is not yet approved or cleared by the [place] FDA and  has been authorized for detection and/or diagnosis of SARS-CoV-2 by FDA under an Emergency Use Authorization (EUA). This EUA will remain  in effect (meaning this test can be used) for th e duration of the COVID-19 declaration under Section 564(b)(1) of the Act, 21 U.S.C. section 360bbb-3(b)(1), unless the authorization is terminated or revoked sooner. Performed at [facility], [place] [address], [place], [place] [address]      Time coordinating discharge: 45 minutes  SIGNED:   Tawni Millers, MD  [facility] 02/26/2019, 8:54 AM

## 2019-02-26 NOTE — Care Management (Signed)
Person Under Monitoring Name: Cody Koch  Location: [address] [place] [address]   Infection Prevention Recommendations for Individuals Confirmed to have, or Being Evaluated for, 2019 Novel Coronavirus (COVID-19) Infection Who Receive Care at Home  Individuals who are confirmed to have, or are being evaluated for, COVID-19 should follow the prevention steps below until a healthcare provider or local or state health department says they can return to normal activities.  Stay home except to get medical care You should restrict activities outside your home, except for getting medical care. Do not go to work, school, or public areas, and do not use public transportation or taxis.  Call ahead before visiting your doctor Before your medical appointment, call the healthcare provider and tell them that you have, or are being evaluated for, COVID-19 infection. This will help the healthcare provider's office take steps to keep other people from getting infected. Ask your healthcare provider to call the local or state health department.  Monitor your symptoms Seek prompt medical attention if your illness is worsening (e.g., difficulty breathing). Before going to your medical appointment, call the healthcare provider and tell them that you have, or are being evaluated for, COVID-19 infection. Ask your healthcare provider to call the local or state health department.  Wear a facemask You should wear a facemask that covers your nose and mouth when you are in the same room with other people and when you visit a healthcare provider. People who live with or visit you should also wear a facemask while they are in the same room with you.  Separate yourself from other people in your home As much as possible, you should stay in a different room from other people in your home. Also, you should use a separate bathroom, if available.  Avoid sharing household items You should not share dishes,  drinking glasses, cups, eating utensils, towels, bedding, or other items with other people in your home. After using these items, you should wash them thoroughly with soap and water.  Cover your coughs and sneezes Cover your mouth and nose with a tissue when you cough or sneeze, or you can cough or sneeze into your sleeve. Throw used tissues in a lined trash can, and immediately wash your hands with soap and water for at least 20 seconds or use an alcohol-based hand rub.  Wash your [employer] your hands often and thoroughly with soap and water for at least 20 seconds. You can use an alcohol-based hand sanitizer if soap and water are not available and if your hands are not visibly dirty. Avoid touching your eyes, nose, and mouth with unwashed hands.   Prevention Steps for Caregivers and Household Members of Individuals Confirmed to have, or Being Evaluated for, COVID-19 Infection Being Cared for in the Home  If you live with, or provide care at home for, a person confirmed to have, or being evaluated for, COVID-19 infection please follow these guidelines to prevent infection:  Follow healthcare provider's instructions Make sure that you understand and can help the patient follow any healthcare provider instructions for all care.  Provide for the patient's basic needs You should help the patient with basic needs in the home and provide support for getting groceries, prescriptions, and other personal needs.  Monitor the patient's symptoms If they are getting sicker, call his or her medical provider and tell them that the patient has, or is being evaluated for, COVID-19 infection. This will help the healthcare provider's office  take steps to keep other people from getting infected. Ask the healthcare provider to call the local or state health department.  Limit the number of people who have contact with the patient  If possible, have only one caregiver for the patient.  Other  household members should stay in another home or place of residence. If this is not possible, they should stay  in another room, or be separated from the patient as much as possible. Use a separate bathroom, if available.  Restrict visitors who do not have an essential need to be in the home.  Keep older adults, very young children, and other sick people away from the patient Keep older adults, very young children, and those who have compromised immune systems or chronic health conditions away from the patient. This includes people with chronic heart, lung, or kidney conditions, diabetes, and cancer.  Ensure good ventilation Make sure that shared spaces in the home have good air flow, such as from an air conditioner or an opened window, weather permitting.  Wash your hands often  Wash your hands often and thoroughly with soap and water for at least 20 seconds. You can use an alcohol based hand sanitizer if soap and water are not available and if your hands are not visibly dirty.  Avoid touching your eyes, nose, and mouth with unwashed hands.  Use disposable paper towels to dry your hands. If not available, use dedicated cloth towels and replace them when they become wet.  Wear a facemask and gloves  Wear a disposable facemask at all times in the room and gloves when you touch or have contact with the patient's blood, body fluids, and/or secretions or excretions, such as sweat, saliva, sputum, nasal mucus, vomit, urine, or feces.  Ensure the mask fits over your nose and mouth tightly, and do not touch it during use.  Throw out disposable facemasks and gloves after using them. Do not reuse.  Wash your hands immediately after removing your facemask and gloves.  If your personal clothing becomes contaminated, carefully remove clothing and launder. Wash your hands after handling contaminated clothing.  Place all used disposable facemasks, gloves, and other waste in a lined container before  disposing them with other household waste.  Remove gloves and wash your hands immediately after handling these items.  Do not share dishes, glasses, or other household items with the patient  Avoid sharing household items. You should not share dishes, drinking glasses, cups, eating utensils, towels, bedding, or other items with a patient who is confirmed to have, or being evaluated for, COVID-19 infection.  After the person uses these items, you should wash them thoroughly with soap and water.  Wash laundry thoroughly  Immediately remove and wash clothes or bedding that have blood, body fluids, and/or secretions or excretions, such as sweat, saliva, sputum, nasal mucus, vomit, urine, or feces, on them.  Wear gloves when handling laundry from the patient.  Read and follow directions on labels of laundry or clothing items and detergent. In general, wash and dry with the warmest temperatures recommended on the label.  Clean all areas the individual has used often  Clean all touchable surfaces, such as counters, tabletops, doorknobs, bathroom fixtures, toilets, phones, keyboards, tablets, and bedside tables, every day. Also, clean any surfaces that may have blood, body fluids, and/or secretions or excretions on them.  Wear gloves when cleaning surfaces the patient has come in contact with.  Use a diluted bleach solution (e.g., dilute bleach with 1 part  bleach and 10 parts water) or a household disinfectant with a label that says EPA-registered for coronaviruses. To make a bleach solution at home, add 1 tablespoon of bleach to 1 quart (4 cups) of water. For a larger supply, add  cup of bleach to 1 gallon (16 cups) of water.  Read labels of cleaning products and follow recommendations provided on product labels. Labels contain instructions for safe and effective use of the cleaning product including precautions you should take when applying the product, such as wearing gloves or eye protection  and making sure you have good ventilation during use of the product.  Remove gloves and wash hands immediately after cleaning.  Monitor yourself for signs and symptoms of illness Caregivers and household members are considered close contacts, should monitor their health, and will be asked to limit movement outside of the home to the extent possible. Follow the monitoring steps for close contacts listed on the symptom monitoring form.   ? If you have additional questions, contact your local health department or call the epidemiologist on call at [phone] (available 24/7). ? This guidance is subject to change. For the most up-to-date guidance from [facility], please refer to their website: [email or web address]

## 2019-02-26 NOTE — Progress Notes (Signed)
Occupational Therapy Evaluation Patient Details Name: Cody Koch MRN: [id number] DOB: Jun 11, 1980 Today's Date: 02/26/2019    History of Present Illness 39 year old male who presented with fatigue, myalgias and diarrhea.  He does have significant past medical history of alcohol abuse.  Patient has been quarantining himself at home since February 7, due to dyspnea and a positive COVID-19 02/17/19.  He presented to the hospital due to worsening symptoms.     Clinical Impression   Educated pt on energy conservation and activity modification. SpO2 93 and above on RA with all activity. PT expressed concerns over lifestyle changes to become healthier. No further OT needed.     Follow Up Recommendations  No OT follow up    Equipment Recommendations  None recommended by OT    Recommendations for Other Services       Precautions / Restrictions Precautions Precautions: None Restrictions Weight Bearing Restrictions: No      Mobility Bed Mobility Overal bed mobility: Independent                Transfers Overall transfer level: Independent Equipment used: None                  Balance Overall balance assessment: Independent                                         ADL either performed or assessed with clinical judgement   ADL Overall ADL's : At baseline                                             Vision         Perception     Praxis      Pertinent Vitals/Pain Pain Assessment: No/denies pain     Hand Dominance Right   Extremity/Trunk Assessment Upper Extremity Assessment Upper Extremity Assessment: Overall WFL for tasks assessed   Lower Extremity Assessment Lower Extremity Assessment: Defer to PT evaluation   Cervical / Trunk Assessment Cervical / Trunk Assessment: Normal   Communication Communication Communication: No difficulties   Cognition Arousal/Alertness: Awake/alert Behavior During Therapy: WFL for  tasks assessed/performed Overall Cognitive Status: Within Functional Limits for tasks assessed                                     General Comments       Exercises Exercises: Other exercises Other Exercises Other Exercises: encouraged continued use of flutter valve and incentive spirometer   Shoulder Instructions      Home Living Family/patient expects to be discharged to:: Private residence Living Arrangements: Parent;Other (Comment) Available Help at Discharge: Family Type of Home: Apartment Home Access: Stairs to enter Entrance Stairs-Number of Steps: 10-15 Entrance Stairs-Rails: Left Home Layout: One level     Bathroom Shower/Tub: Chief Strategy Officer: Standard Bathroom Accessibility: No   Home Equipment: Other (comment)(hiking stick)   Additional Comments: Lives in apt above garage at [employer] house      Prior Functioning/Environment Level of Independence: Independent        Comments: Emergency planning/management officer        OT Problem List: Decreased activity tolerance      OT Treatment/Interventions:  OT Goals(Current goals can be found in the care plan section) Acute Rehab OT Goals Patient Stated Goal: to get home OT Goal Formulation: All assessment and education complete, DC therapy  OT Frequency:     Barriers to D/C:            Co-evaluation              AM-PAC OT "6 Clicks" Daily Activity     Outcome Measure Help from another person eating meals?: None Help from another person taking care of personal grooming?: None Help from another person toileting, which includes using toliet, bedpan, or urinal?: None Help from another person bathing (including washing, rinsing, drying)?: None Help from another person to put on and taking off regular upper body clothing?: None Help from another person to put on and taking off regular lower body clothing?: None 6 Click Score: 24   End of Session Nurse Communication: Mobility  status  Activity Tolerance: Patient tolerated treatment well Patient left: in chair;with call bell/phone within reach  OT Visit Diagnosis: Muscle weakness (generalized) (M62.81)                Time: 6256-3893 OT Time Calculation (min): 20 min Charges:  OT General Charges $OT Visit: 1 Visit OT Evaluation $OT Eval Low Complexity: [place], OT/L   Acute OT Clinical Specialist Acute Rehabilitation Services Pager [phone] Office [phone]   [facility] 02/26/2019, 2:05 PM

## 2019-02-26 NOTE — Treatment Plan (Addendum)
SATURATION QUALIFICATIONS: (This note is used to comply with regulatory documentation for home oxygen)  Patient Saturations on Room Air at Rest = 90%  Patient Saturations on Room Air while Ambulating = **87%  Patient Saturations on 2 Liters of oxygen while Ambulating = 90%  Please briefly explain why patient needs home oxygen: \ Patient gets DOE & starts to desat w/ activity. MD , RN & patient feel it is most appropriate for pt to go home w/ Oxygen therapy.

## 2019-02-26 NOTE — TOC Transition Note (Signed)
Transition of Care [facility]) - CM/SW Discharge Note   Patient Details  Name: Cody Koch MRN: [id number] Date of Birth: 06-May-1980  Transition of Care [facility]) CM/SW Contact:  Darleene Cleaver, LCSW Phone Number: 02/26/2019, 3:17 PM   Clinical Narrative:     Patient will be going home with oxygen through [employer].  CSW signing off please reconsult with any other social work needs, oxygen company has been notified of planned discharge.   Final next level of care: Home/Self Care Barriers to Discharge: Barriers Resolved   Patient Goals and CMS Choice Patient states their goals for this hospitalization and ongoing recovery are:: To return back home with oxygen. CMS Medicare.gov Compare Post Acute Care list provided to:: Patient Choice offered to / list presented to : Patient  Discharge Placement  Patient going back home with oxygen, no home health needed.                     Discharge Plan and Services                DME Arranged: Oxygen DME Agency: [facility] Date DME Agency Contacted: 02/26/19 Time DME Agency Contacted: 1415 Representative spoke with at DME Agency: [facility] Fortes [facility] Arranged: NA          Social Determinants of Health (SDOH) Interventions     Readmission Risk Interventions No flowsheet data found.

## 2019-02-26 NOTE — Discharge Planning (Signed)
Patient was discharged home around 1330. Pt d/c with all personal belongings, O2 tan in car & 1 tank delivered to pt home (confirmed). D/C w/ pulse ox & went over all discharge instructions, medications, follow-up care & COVID education. IV taken out w/ no complications. Mother picked up pt & both of them were wearing a mask-pt sitting in backseat.  All questions & concerns addressed w/ patient.

## 2019-02-27 LAB — CULTURE, BLOOD (ROUTINE X 2)
Culture: NO GROWTH
Culture: NO GROWTH
Special Requests: ADEQUATE
Special Requests: ADEQUATE

## 2019-12-22 DIAGNOSIS — Z1329 Encounter for screening for other suspected endocrine disorder: Secondary | ICD-10-CM | POA: Diagnosis not present

## 2019-12-22 DIAGNOSIS — Z13228 Encounter for screening for other metabolic disorders: Secondary | ICD-10-CM | POA: Diagnosis not present

## 2019-12-22 DIAGNOSIS — R0789 Other chest pain: Secondary | ICD-10-CM | POA: Diagnosis not present

## 2019-12-22 DIAGNOSIS — Z130 Encounter for screening for diseases of the blood and blood-forming organs and certain disorders involving the immune mechanism: Secondary | ICD-10-CM | POA: Diagnosis not present

## 2019-12-22 DIAGNOSIS — Z113 Encounter for screening for infections with a predominantly sexual mode of transmission: Secondary | ICD-10-CM | POA: Diagnosis not present

## 2019-12-22 DIAGNOSIS — Z13220 Encounter for screening for lipoid disorders: Secondary | ICD-10-CM | POA: Diagnosis not present

## 2019-12-22 DIAGNOSIS — Z0000 Encounter for general adult medical examination without abnormal findings: Secondary | ICD-10-CM | POA: Diagnosis not present

## 2020-06-12 IMAGING — DX DG CHEST 1V PORT
1 series · 1 of 1 positions shown · non-contrast
Comparison: None.

CLINICAL DATA: Fever and shortness of breath.

EXAM:
PORTABLE CHEST 1 VIEW

[chest ap]
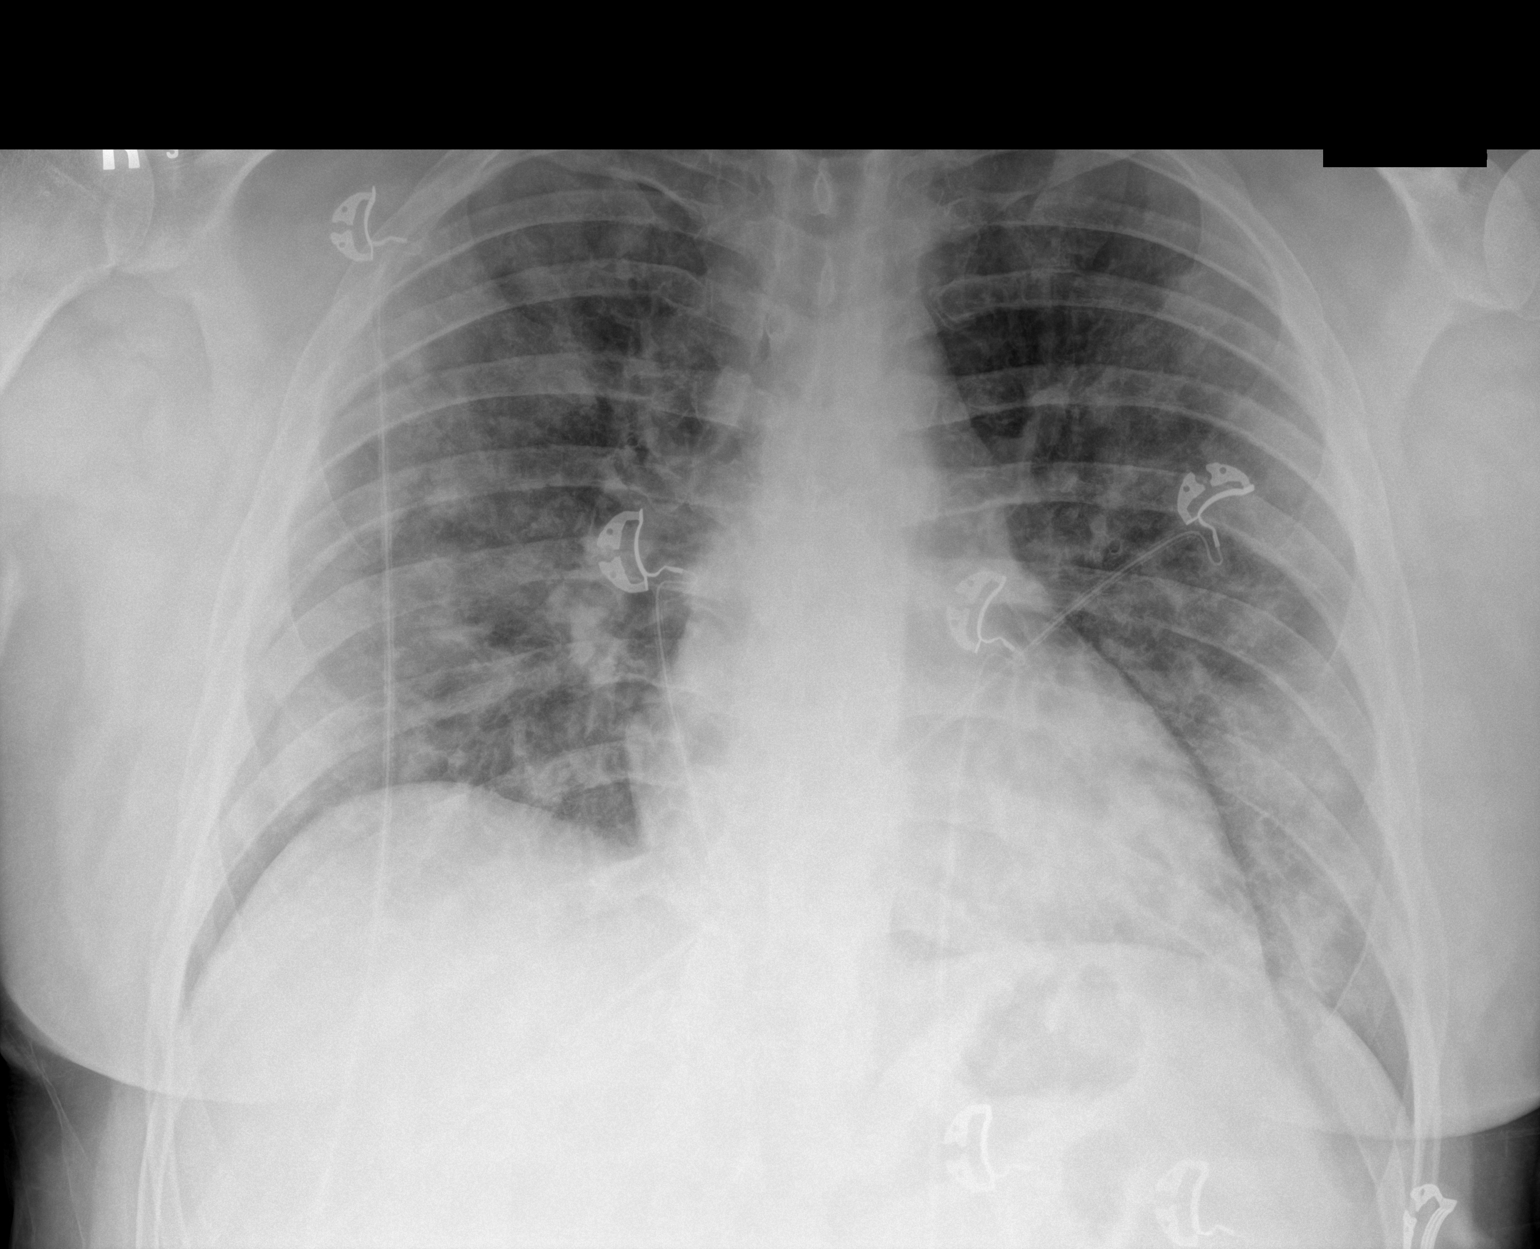

[1 of 1 positions shown; findings below may reference images not displayed]

FINDINGS: The cardiac silhouette, mediastinal and hilar contours are within
normal limits.

Multifocal patchy lung infiltrates suggesting atypical/viral
pneumonia such as COVID pneumonia. Recommend clinical correlation.

No pleural effusions. The bony thorax is intact.
IMPRESSION: Diffuse lung infiltrates.

## 2020-07-07 DIAGNOSIS — M5451 Vertebrogenic low back pain: Secondary | ICD-10-CM | POA: Diagnosis not present

## 2020-07-07 DIAGNOSIS — M9902 Segmental and somatic dysfunction of thoracic region: Secondary | ICD-10-CM | POA: Diagnosis not present

## 2020-07-07 DIAGNOSIS — M9903 Segmental and somatic dysfunction of lumbar region: Secondary | ICD-10-CM | POA: Diagnosis not present

## 2020-07-07 DIAGNOSIS — M546 Pain in thoracic spine: Secondary | ICD-10-CM | POA: Diagnosis not present

## 2020-07-14 DIAGNOSIS — M9902 Segmental and somatic dysfunction of thoracic region: Secondary | ICD-10-CM | POA: Diagnosis not present

## 2020-07-14 DIAGNOSIS — M5451 Vertebrogenic low back pain: Secondary | ICD-10-CM | POA: Diagnosis not present

## 2020-07-14 DIAGNOSIS — M546 Pain in thoracic spine: Secondary | ICD-10-CM | POA: Diagnosis not present

## 2020-07-14 DIAGNOSIS — M9903 Segmental and somatic dysfunction of lumbar region: Secondary | ICD-10-CM | POA: Diagnosis not present

## 2020-07-21 DIAGNOSIS — M5451 Vertebrogenic low back pain: Secondary | ICD-10-CM | POA: Diagnosis not present

## 2020-07-21 DIAGNOSIS — M9902 Segmental and somatic dysfunction of thoracic region: Secondary | ICD-10-CM | POA: Diagnosis not present

## 2020-07-21 DIAGNOSIS — M9903 Segmental and somatic dysfunction of lumbar region: Secondary | ICD-10-CM | POA: Diagnosis not present

## 2020-07-21 DIAGNOSIS — M546 Pain in thoracic spine: Secondary | ICD-10-CM | POA: Diagnosis not present

## 2020-07-26 DIAGNOSIS — M5451 Vertebrogenic low back pain: Secondary | ICD-10-CM | POA: Diagnosis not present

## 2020-07-26 DIAGNOSIS — M9903 Segmental and somatic dysfunction of lumbar region: Secondary | ICD-10-CM | POA: Diagnosis not present

## 2020-07-26 DIAGNOSIS — M546 Pain in thoracic spine: Secondary | ICD-10-CM | POA: Diagnosis not present

## 2020-07-26 DIAGNOSIS — M9902 Segmental and somatic dysfunction of thoracic region: Secondary | ICD-10-CM | POA: Diagnosis not present

## 2020-08-11 DIAGNOSIS — M9903 Segmental and somatic dysfunction of lumbar region: Secondary | ICD-10-CM | POA: Diagnosis not present

## 2020-08-11 DIAGNOSIS — M546 Pain in thoracic spine: Secondary | ICD-10-CM | POA: Diagnosis not present

## 2020-08-11 DIAGNOSIS — M5451 Vertebrogenic low back pain: Secondary | ICD-10-CM | POA: Diagnosis not present

## 2020-08-11 DIAGNOSIS — M9902 Segmental and somatic dysfunction of thoracic region: Secondary | ICD-10-CM | POA: Diagnosis not present

## 2020-08-25 DIAGNOSIS — M9903 Segmental and somatic dysfunction of lumbar region: Secondary | ICD-10-CM | POA: Diagnosis not present

## 2020-08-25 DIAGNOSIS — M9902 Segmental and somatic dysfunction of thoracic region: Secondary | ICD-10-CM | POA: Diagnosis not present

## 2020-08-25 DIAGNOSIS — M5451 Vertebrogenic low back pain: Secondary | ICD-10-CM | POA: Diagnosis not present

## 2020-08-25 DIAGNOSIS — M546 Pain in thoracic spine: Secondary | ICD-10-CM | POA: Diagnosis not present

## 2020-09-29 DIAGNOSIS — M546 Pain in thoracic spine: Secondary | ICD-10-CM | POA: Diagnosis not present

## 2020-09-29 DIAGNOSIS — M9903 Segmental and somatic dysfunction of lumbar region: Secondary | ICD-10-CM | POA: Diagnosis not present

## 2020-09-29 DIAGNOSIS — M5451 Vertebrogenic low back pain: Secondary | ICD-10-CM | POA: Diagnosis not present

## 2020-09-29 DIAGNOSIS — M9902 Segmental and somatic dysfunction of thoracic region: Secondary | ICD-10-CM | POA: Diagnosis not present

## 2021-01-17 DIAGNOSIS — K625 Hemorrhage of anus and rectum: Secondary | ICD-10-CM | POA: Diagnosis not present

## 2021-01-17 DIAGNOSIS — Z130 Encounter for screening for diseases of the blood and blood-forming organs and certain disorders involving the immune mechanism: Secondary | ICD-10-CM | POA: Diagnosis not present

## 2021-01-17 DIAGNOSIS — Z1321 Encounter for screening for nutritional disorder: Secondary | ICD-10-CM | POA: Diagnosis not present

## 2021-01-17 DIAGNOSIS — Z1329 Encounter for screening for other suspected endocrine disorder: Secondary | ICD-10-CM | POA: Diagnosis not present

## 2021-01-17 DIAGNOSIS — Z0000 Encounter for general adult medical examination without abnormal findings: Secondary | ICD-10-CM | POA: Diagnosis not present

## 2021-01-17 DIAGNOSIS — Z789 Other specified health status: Secondary | ICD-10-CM | POA: Diagnosis not present

## 2021-01-17 DIAGNOSIS — Z13228 Encounter for screening for other metabolic disorders: Secondary | ICD-10-CM | POA: Diagnosis not present

## 2021-01-17 DIAGNOSIS — M546 Pain in thoracic spine: Secondary | ICD-10-CM | POA: Diagnosis not present

## 2021-01-23 DIAGNOSIS — K625 Hemorrhage of anus and rectum: Secondary | ICD-10-CM | POA: Diagnosis not present

## 2021-06-19 DIAGNOSIS — T1591XA Foreign body on external eye, part unspecified, right eye, initial encounter: Secondary | ICD-10-CM | POA: Diagnosis not present

## 2021-08-06 DIAGNOSIS — D2272 Melanocytic nevi of left lower limb, including hip: Secondary | ICD-10-CM | POA: Diagnosis not present

## 2021-08-06 DIAGNOSIS — D225 Melanocytic nevi of trunk: Secondary | ICD-10-CM | POA: Diagnosis not present

## 2021-08-06 DIAGNOSIS — D2262 Melanocytic nevi of left upper limb, including shoulder: Secondary | ICD-10-CM | POA: Diagnosis not present

## 2021-08-06 DIAGNOSIS — D2261 Melanocytic nevi of right upper limb, including shoulder: Secondary | ICD-10-CM | POA: Diagnosis not present

## 2021-09-12 ENCOUNTER — Telehealth: Payer: Self-pay

## 2021-09-12 NOTE — Telephone Encounter (Signed)
noted 

## 2021-09-12 NOTE — Telephone Encounter (Signed)
Patient's wife called, Cody Koch [phone]. She would like to know if Dr Darrick Huntsman will take her husband as a new patient.

## 2021-09-13 NOTE — Telephone Encounter (Signed)
Lm to call office to set up new patient appointment with Dr Darrick Huntsman.

## 2021-09-24 DIAGNOSIS — M542 Cervicalgia: Secondary | ICD-10-CM | POA: Diagnosis not present

## 2021-09-24 DIAGNOSIS — M545 Low back pain, unspecified: Secondary | ICD-10-CM | POA: Diagnosis not present

## 2021-09-24 DIAGNOSIS — M25512 Pain in left shoulder: Secondary | ICD-10-CM | POA: Diagnosis not present

## 2021-11-05 ENCOUNTER — Encounter: Payer: Self-pay | Admitting: Internal Medicine

## 2021-11-05 ENCOUNTER — Ambulatory Visit (INDEPENDENT_AMBULATORY_CARE_PROVIDER_SITE_OTHER): Payer: BC Managed Care – PPO | Admitting: Internal Medicine

## 2021-11-05 VITALS — BP 142/78 | HR 80 | Temp 98.0°F | Ht 73.0 in | Wt 308.0 lb

## 2021-11-05 DIAGNOSIS — R0681 Apnea, not elsewhere classified: Secondary | ICD-10-CM | POA: Insufficient documentation

## 2021-11-05 DIAGNOSIS — I159 Secondary hypertension, unspecified: Secondary | ICD-10-CM

## 2021-11-05 DIAGNOSIS — I10 Essential (primary) hypertension: Secondary | ICD-10-CM | POA: Insufficient documentation

## 2021-11-05 DIAGNOSIS — F1411 Cocaine abuse, in remission: Secondary | ICD-10-CM

## 2021-11-05 DIAGNOSIS — R635 Abnormal weight gain: Secondary | ICD-10-CM | POA: Diagnosis not present

## 2021-11-05 DIAGNOSIS — H9192 Unspecified hearing loss, left ear: Secondary | ICD-10-CM | POA: Insufficient documentation

## 2021-11-05 DIAGNOSIS — E871 Hypo-osmolality and hyponatremia: Secondary | ICD-10-CM | POA: Diagnosis not present

## 2021-11-05 DIAGNOSIS — H833X2 Noise effects on left inner ear: Secondary | ICD-10-CM

## 2021-11-05 DIAGNOSIS — F1010 Alcohol abuse, uncomplicated: Secondary | ICD-10-CM

## 2021-11-05 DIAGNOSIS — R0683 Snoring: Secondary | ICD-10-CM | POA: Diagnosis not present

## 2021-11-05 DIAGNOSIS — E669 Obesity, unspecified: Secondary | ICD-10-CM

## 2021-11-05 LAB — CBC WITH DIFFERENTIAL/PLATELET
Basophils Absolute: 0 10*3/uL (ref 0.0–0.1)
Basophils Relative: 0.6 % (ref 0.0–3.0)
Eosinophils Absolute: 0.1 10*3/uL (ref 0.0–0.7)
Eosinophils Relative: 0.9 % (ref 0.0–5.0)
HCT: 45.1 % (ref 39.0–52.0)
Hemoglobin: 15.3 g/dL (ref 13.0–17.0)
Lymphocytes Relative: 21.2 % (ref 12.0–46.0)
Lymphs Abs: 1.6 10*3/uL (ref 0.7–4.0)
MCHC: 33.9 g/dL (ref 30.0–36.0)
MCV: 92.6 fl (ref 78.0–100.0)
Monocytes Absolute: 0.6 10*3/uL (ref 0.1–1.0)
Monocytes Relative: 7.5 % (ref 3.0–12.0)
Neutro Abs: 5.4 10*3/uL (ref 1.4–7.7)
Neutrophils Relative %: 69.8 % (ref 43.0–77.0)
Platelets: 191 10*3/uL (ref 150.0–400.0)
RBC: 4.87 Mil/uL (ref 4.22–5.81)
RDW: 13.2 % (ref 11.5–15.5)
WBC: 7.8 10*3/uL (ref 4.0–10.5)

## 2021-11-05 LAB — COMPREHENSIVE METABOLIC PANEL
ALT: 34 U/L (ref 0–53)
AST: 24 U/L (ref 0–37)
Albumin: 4.7 g/dL (ref 3.5–5.2)
Alkaline Phosphatase: 62 U/L (ref 39–117)
BUN: 19 mg/dL (ref 6–23)
CO2: 27 mEq/L (ref 19–32)
Calcium: 9.7 mg/dL (ref 8.4–10.5)
Chloride: 101 mEq/L (ref 96–112)
Creatinine, Ser: 1 mg/dL (ref 0.40–1.50)
GFR: 94.7 mL/min (ref >60.00)
Glucose, Bld: 94 mg/dL (ref 70–99)
Potassium: 4 mEq/L (ref 3.5–5.1)
Sodium: 140 mEq/L (ref 135–145)
Total Bilirubin: 0.5 mg/dL (ref 0.2–1.2)
Total Protein: 7.9 g/dL (ref 6.0–8.3)

## 2021-11-05 LAB — HEMOGLOBIN A1C: Hgb A1c MFr Bld: 5.5 % (ref 4.6–6.5)

## 2021-11-05 LAB — TSH: TSH: 4.42 u[IU]/mL (ref 0.35–5.50)

## 2021-11-05 MED ORDER — METOPROLOL SUCCINATE ER 25 MG PO TB24: 25.0000 mg | ORAL_TABLET | Freq: Every day | ORAL | 3 refills | Status: DC

## 2021-11-05 MED ORDER — ALBUTEROL SULFATE HFA 108 (90 BASE) MCG/ACT IN AERS: 2.0000 | INHALATION_SPRAY | Freq: Four times a day (QID) | RESPIRATORY_TRACT | 11 refills | Status: DC | PRN

## 2021-11-05 NOTE — Progress Notes (Signed)
Subjective:  Patient ID: Cody Koch, male    DOB: 10-06-1980  Age: 41 y.o. MRN: [id number]  CC: The primary encounter diagnosis was Hyponatremia. 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  HPI Cody Koch presents for establishment of care  Cody Koch is a 41 yr old male with a history of asthma, hypertension and hyperlipidemia currently unmanaged who presents ot establish care   History of cocaine use from age 76 to 11,  then began using from 89 to until age 50, when he was hospitalized for respiratory failure attributed to COVID.  ECHO was normal.  He has been drug  abstinent  since 2021 ,  but began drinking beer in excess  nightly,  but over the last several weeks has been tapering daily use from 7 down to 3  per night currently.   Anxiety:  tried zoloft a few years ago , and 2 or 3 other SSRIs;  changed his personality completely  .Had a dysfunctional family   parents argued with each other and  took out their frustrations on him.  His brother was the golden child .  Mother is a narcissist. ..  Poor sleep .  Early waking.  Gets sleepy by 10:30 ,  wide awake after void at 3:30   History of eardrum surgery x 2 on the left,  some hearing impairment noted on the left by [name] . Worked in Insurance claims handler for years,  didn't use ear protection until recently.  Has a 62 yr old son from  his first marriage  who lives with his mother,  has asthma   SNORING , [name] HAS NOTICED APNEIC SPELLS.  HAS NOT HAD A SLEEP STUDY . WANTS TO DO IT AT HOME.    History Cody Koch has a past medical history of Asthma, Gastroenteritis due to COVID-19 virus (02/22/2019), HTN (hypertension), Hyperlipidemia, and Pneumonia due to COVID-19 virus (02/22/2019).   He has no past surgical  history on file.   His family history includes Alcohol abuse in his maternal grandmother and mother; Arthritis in his paternal grandfather; Asthma in his son; Birth defects in his mother and son; Cancer (age of onset: 1) in his paternal grandmother; Cancer (age of onset: 55) in his paternal grandfather; Depression in his mother; Diabetes in his maternal grandfather and mother; Drug abuse in his maternal grandmother; Early death in his paternal grandmother; Hyperlipidemia in his maternal grandmother, mother, and paternal grandfather; Hypertension in his maternal grandmother, mother, and paternal grandfather; Kidney disease in his mother; Miscarriages / Stillbirths in his mother.He reports that he quit smoking about 5 years ago. His smoking use included cigarettes. He has never used smokeless tobacco. He reports that he does not currently use alcohol. He reports that he does not currently use drugs.  Outpatient Medications Prior to Visit  Medication Sig Dispense Refill   Multiple Vitamin (MULTIVITAMIN WITH MINERALS) TABS tablet Take 1 tablet by mouth daily.     albuterol (VENTOLIN HFA) 108 (90 Base) MCG/ACT inhaler Inhale 1 puff into the lungs every 6 (six) hours as needed for wheezing or shortness of breath. (Patient not taking: Reported on 11/05/2021) 8 g 0   cholecalciferol (VITAMIN D3) 25 MCG (1000 UNIT) tablet Take 1,000 Units by mouth daily. (Patient not taking: Reported on 11/05/2021)  guaiFENesin-dextromethorphan (ROBITUSSIN DM) 100-10 MG/5ML syrup Take 10 mLs by mouth every 6 (six) hours as needed for cough. (Patient not taking: Reported on 11/05/2021) 118 mL 0   milk thistle 175 MG tablet Take 175 mg by mouth as directed. (Patient not taking: Reported on 11/05/2021)     Multiple Vitamins-Minerals (AIRBORNE PO) Take 1 tablet by mouth as directed. (Patient not taking: Reported on 11/05/2021)     Turmeric 500 MG CAPS Take 500 mg by mouth as directed. (Patient not taking: Reported on 11/05/2021)      vitamin B-12 (CYANOCOBALAMIN) 1000 MCG tablet Take 1,000 mcg by mouth daily. (Patient not taking: Reported on 11/05/2021)     No facility-administered medications prior to visit.    Review of Systems:  Patient denies headache, fevers, malaise, unintentional weight loss, skin rash, eye pain, sinus congestion and sinus pain, sore throat, dysphagia,  hemoptysis , cough, dyspnea, wheezing, chest pain, palpitations, orthopnea, edema, abdominal pain, nausea, melena, diarrhea, constipation, flank pain, dysuria, hematuria, urinary  Frequency, nocturia, numbness, tingling, seizures,  Focal weakness, Loss of consciousness,  Tremor, insomnia, depression, anxiety, and suicidal ideation.     Objective:  BP (!) 142/78 (BP Location: Left Arm, Patient Position: Sitting, Cuff Size: Large)   Pulse 80   Temp 98 F (36.7 C) (Oral)   Ht 6\' 1"  (1.854 m)   Wt (!) 308 lb (139.7 kg)   SpO2 97%   BMI 40.64 kg/m   Physical Exam:   Assessment & Plan:   Problem List Items Addressed This Visit     Witnessed apneic spells    Noted by wife, during snoring episodes. Sleep study ordered       Relevant Orders   Home sleep test   RESOLVED: Hyponatremia - Primary   Relevant Orders   CBC with Differential/Platelet (Completed)   Hypertension    Untreated for years.  Starting metoprolol given recurrent episodes of anxiety with tension headaches and tachycardia      Relevant Medications   metoprolol succinate (TOPROL-XL) 25 MG 24 hr tablet   History of cocaine abuse (HCC)    He has been abstinent since 2020 hospitalization for respiratory failure       Hearing loss in left ear   ETOH abuse    He is very self aware, and has graduallyreduced his daily consumption from 7 beers to 3 daily.  He plans to continue reducing to less than once daily.  No history of withdrawal      Other Visit Diagnoses     Abnormal weight gain       Relevant Orders   Comprehensive metabolic panel (Completed)   Hemoglobin  A1c (Completed)   TSH (Completed)   Lipid Panel w/reflex Direct LDL   Snoring       Relevant Orders   Home sleep test   Obesity (BMI 30.0-34.9)       Relevant Orders   Home sleep test       I provided  60 minutes  during this encounter reviewing patient's current problems and past surgeries, labs and imaging studies, providing counseling on the above mentioned problems I n a face to face visit  , and coordination  of care .  Meds ordered this encounter  Medications   metoprolol succinate (TOPROL-XL) 25 MG 24 hr tablet    Sig: Take 1 tablet (25 mg total) by mouth daily.    Dispense:  90 tablet    Refill:  3   albuterol (VENTOLIN HFA) 108 (90  Base) MCG/ACT inhaler    Sig: Inhale 2 puffs into the lungs every 6 (six) hours as needed for wheezing or shortness of breath.    Dispense:  3.7 g    Refill:  11    GENERIC    Medications Discontinued During This Encounter  Medication Reason   cholecalciferol (VITAMIN D3) 25 MCG (1000 UNIT) tablet    guaiFENesin-dextromethorphan (ROBITUSSIN DM) 100-10 MG/5ML syrup    milk thistle 175 MG tablet    Multiple Vitamins-Minerals (AIRBORNE PO)    Turmeric 500 MG CAPS    vitamin B-12 (CYANOCOBALAMIN) 1000 MCG tablet     Follow-up: No follow-ups on file.   Crecencio Mc, MD

## 2021-11-05 NOTE — Assessment & Plan Note (Signed)
Untreated for years.  Starting metoprolol given recurrent episodes of anxiety with tension headaches and tachycardia

## 2021-11-05 NOTE — Assessment & Plan Note (Signed)
He has been abstinent since 2020 hospitalization for respiratory failure

## 2021-11-05 NOTE — Patient Instructions (Addendum)
Welcome, Cody Koch !  I recommend starting Toprol XL for your stress/headaches/hypertension   For the insomnia:  You might want to try using Relaxium for insomnia  (as seen on TV commercials) . It is available through [employer] and contains all natural supplements:  Melatonin 5 mg  Chamomile 25 mg Passionflower extract 75 mg GABA 100 mg Ashwaganda extract 125 mg Magnesium citrate, glycinate, oxide (100 mg)  L tryptophan 500 mg Valerest (proprietary  ingredient ; probably valeria root extract)

## 2021-11-05 NOTE — Assessment & Plan Note (Signed)
Noted by wife, during snoring episodes. Sleep study ordered

## 2021-11-05 NOTE — Assessment & Plan Note (Signed)
He is very self aware, and has graduallyreduced his daily consumption from 7 beers to 3 daily.  He plans to continue reducing to less than once daily.  No history of withdrawal

## 2021-11-06 ENCOUNTER — Other Ambulatory Visit: Payer: BC Managed Care – PPO

## 2021-11-06 DIAGNOSIS — R635 Abnormal weight gain: Secondary | ICD-10-CM | POA: Diagnosis not present

## 2021-11-06 NOTE — Progress Notes (Signed)
Order sent to [facility].

## 2021-11-07 LAB — LIPID PANEL W/REFLEX DIRECT LDL
Cholesterol: 223 mg/dL — ABNORMAL HIGH (ref <200)
HDL: 62 mg/dL (ref >=40)
LDL Cholesterol (Calc): 127 mg/dL (calc) — ABNORMAL HIGH
Non-HDL Cholesterol (Calc): 161 mg/dL (calc) — ABNORMAL HIGH (ref <130)
Total CHOL/HDL Ratio: 3.6 (calc) (ref <5.0)
Triglycerides: 198 mg/dL — ABNORMAL HIGH (ref <150)

## 2021-11-13 DIAGNOSIS — R0602 Shortness of breath: Secondary | ICD-10-CM | POA: Diagnosis not present

## 2021-11-13 DIAGNOSIS — G4733 Obstructive sleep apnea (adult) (pediatric): Secondary | ICD-10-CM | POA: Diagnosis not present

## 2021-11-14 DIAGNOSIS — G4733 Obstructive sleep apnea (adult) (pediatric): Secondary | ICD-10-CM | POA: Diagnosis not present

## 2021-11-14 DIAGNOSIS — R0602 Shortness of breath: Secondary | ICD-10-CM | POA: Diagnosis not present

## 2021-11-29 ENCOUNTER — Telehealth: Payer: Self-pay

## 2021-11-29 ENCOUNTER — Encounter: Payer: Self-pay | Admitting: Internal Medicine

## 2021-11-29 DIAGNOSIS — G4733 Obstructive sleep apnea (adult) (pediatric): Secondary | ICD-10-CM | POA: Insufficient documentation

## 2021-11-29 NOTE — Telephone Encounter (Signed)
Chalmers Cater, CRT RCP, from [facility] dropped off form for Dr. Duncan Dull to sign and sleep study for patient.  Fayrene Fearing asked that Dr. Darrick Huntsman please sign and return the top form to him.  Fayrene Fearing states the sleep study is for Dr. Darrick Huntsman to keep.  I placed the forms in Dr. Melina Schools color folder up front.

## 2021-11-29 NOTE — Telephone Encounter (Signed)
Placed in quick sign folder.  

## 2021-11-30 NOTE — Telephone Encounter (Signed)
faxed

## 2021-12-10 DIAGNOSIS — G4733 Obstructive sleep apnea (adult) (pediatric): Secondary | ICD-10-CM | POA: Diagnosis not present

## 2022-01-09 DIAGNOSIS — G4733 Obstructive sleep apnea (adult) (pediatric): Secondary | ICD-10-CM | POA: Diagnosis not present

## 2022-08-08 ENCOUNTER — Encounter: Payer: Self-pay | Admitting: Internal Medicine

## 2022-08-08 ENCOUNTER — Ambulatory Visit: Payer: BC Managed Care – PPO | Attending: Internal Medicine

## 2022-08-08 ENCOUNTER — Ambulatory Visit: Payer: BC Managed Care – PPO | Admitting: Internal Medicine

## 2022-08-08 VITALS — BP 164/100 | HR 91 | Temp 97.6°F | Ht 73.0 in | Wt 325.6 lb

## 2022-08-08 DIAGNOSIS — R002 Palpitations: Secondary | ICD-10-CM | POA: Diagnosis not present

## 2022-08-08 DIAGNOSIS — Z0001 Encounter for general adult medical examination with abnormal findings: Secondary | ICD-10-CM

## 2022-08-08 DIAGNOSIS — R079 Chest pain, unspecified: Secondary | ICD-10-CM | POA: Diagnosis not present

## 2022-08-08 DIAGNOSIS — I159 Secondary hypertension, unspecified: Secondary | ICD-10-CM | POA: Diagnosis not present

## 2022-08-08 DIAGNOSIS — F1010 Alcohol abuse, uncomplicated: Secondary | ICD-10-CM

## 2022-08-08 DIAGNOSIS — E785 Hyperlipidemia, unspecified: Secondary | ICD-10-CM

## 2022-08-08 DIAGNOSIS — F1411 Cocaine abuse, in remission: Secondary | ICD-10-CM

## 2022-08-08 DIAGNOSIS — G4733 Obstructive sleep apnea (adult) (pediatric): Secondary | ICD-10-CM | POA: Diagnosis not present

## 2022-08-08 LAB — LIPID PANEL
Cholesterol: 200 mg/dL (ref 0–200)
HDL: 47.7 mg/dL (ref >39.00)
LDL Cholesterol: 127 mg/dL — ABNORMAL HIGH (ref 0–99)
NonHDL: 152.14
Total CHOL/HDL Ratio: 4
Triglycerides: 127 mg/dL (ref 0.0–149.0)
VLDL: 25.4 mg/dL (ref 0.0–40.0)

## 2022-08-08 LAB — MICROALBUMIN / CREATININE URINE RATIO
Creatinine,U: 67.8 mg/dL
Microalb Creat Ratio: 1 mg/g (ref 0.0–30.0)
Microalb, Ur: <0.7 mg/dL (ref 0.0–1.9)

## 2022-08-08 LAB — COMPREHENSIVE METABOLIC PANEL
ALT: 21 U/L (ref 0–53)
AST: 18 U/L (ref 0–37)
Albumin: 4.3 g/dL (ref 3.5–5.2)
Alkaline Phosphatase: 48 U/L (ref 39–117)
BUN: 10 mg/dL (ref 6–23)
CO2: 29 mEq/L (ref 19–32)
Calcium: 9.5 mg/dL (ref 8.4–10.5)
Chloride: 101 mEq/L (ref 96–112)
Creatinine, Ser: 1.03 mg/dL (ref 0.40–1.50)
GFR: 90.91 mL/min (ref >60.00)
Glucose, Bld: 100 mg/dL — ABNORMAL HIGH (ref 70–99)
Potassium: 4 mEq/L (ref 3.5–5.1)
Sodium: 138 mEq/L (ref 135–145)
Total Bilirubin: 0.5 mg/dL (ref 0.2–1.2)
Total Protein: 7.6 g/dL (ref 6.0–8.3)

## 2022-08-08 LAB — LDL CHOLESTEROL, DIRECT: Direct LDL: 138 mg/dL

## 2022-08-08 NOTE — Progress Notes (Addendum)
Patient ID: Cody Koch, male    DOB: 07-Jan-1981  Age: 42 y.o. MRN: [id number]  The patient is here for annual preventive examination and management of other chronic and acute problems.   The risk factors are reflected in the social history.   The roster of all physicians providing medical care to patient - is listed in the Snapshot section of the chart.   Activities of daily living:  The patient is 100% independent in all ADLs: dressing, toileting, feeding as well as independent mobility   Home safety : The patient has smoke detectors in the home. They wear seatbelts.  There are no unsecured firearms at home. There is no violence in the home.    There is no risks for hepatitis, STDs or HIV. There is no   history of blood transfusion. They have no travel history to infectious disease endemic areas of the world.   The patient has seen their dentist in the last six month. They have seen their eye doctor in the last year. The patinet  denies slight hearing difficulty with regard to whispered voices and some television programs.  They have deferred audiologic testing in the last year.  They do not  have excessive sun exposure. Discussed the need for sun protection: hats, long sleeves and use of sunscreen if there is significant sun exposure.    Diet: the importance of a healthy diet is discussed. They do have a healthy diet.   The benefits of regular aerobic exercise were discussed. The patient  is physically active renovating his home but does not engage in formal exercise currently (former weight lifter)     Depression screen: there are no signs or vegative symptoms of depression- irritability, change in appetite, anhedonia, sadness/tearfullness.   The following portions of the patient's history were reviewed and updated as appropriate: allergies, current medications, past family history, past medical history,  past surgical history, past social history  and problem list.   Visual acuity was not  assessed per patient preference since the patient has regular follow up with an  ophthalmologist. Hearing and body mass index were assessed and reviewed.    During the course of the visit the patient was educated and counseled about appropriate screening and preventive services including : fall prevention , diabetes screening, nutrition counseling, colorectal cancer screening, and recommended immunizations.    Chief Complaint:   1) OSA:  recently diagnosed by home sleep study  Did not tolerate nasal pillows CPAP; tried the full face mask from a friend and tore it off during his sleep .  2) .Intermittent episodes of  intense headache and tinnitus  that occur with position change,  from sitting to standing up.  Occrs 1-2 times daiy for  the  last month .  Symptoms last 4-5 seconds,   no presyncope,  no vertigo or vision changes. Not occurring during physical labor . Occasional palpitations with the episodes.    3)  intermittent episodes of Chest pain/ pressure located in center of chest  non radiating , occurs randomly ,  even at rest ,  occasionally  associated with dizziness.     4)  morbid obesity:   has gained weight.  Drinking 6 beers per night.  Eating a carnivore diet . Plans to join a gym in [place] when he and wife move at the end of August   5) alcohol abuse:  reviewed his plans to stop drinking  'COLD [place]" IN MID August  .  Reviewed  history of illicit drug use and his ability to quit drugs cold [place] in the past    Review of Symptoms  Patient denies  fevers, malaise, unintentional weight loss, skin rash, eye pain, sinus congestion and sinus pain, sore throat, dysphagia,  hemoptysis , cough, dyspnea, wheezing,  orthopnea, edema, abdominal pain, nausea, melena, diarrhea, constipation, flank pain, dysuria, hematuria, urinary  Frequency, nocturia, numbness, tingling, seizures,  Focal weakness, Loss of consciousness,  Tremor, insomnia, depression, anxiety, and suicidal ideation.       Physical Exam:  BP (!) 164/100   Pulse 91   Temp 97.6 F (36.4 C) (Oral)   Ht 6\' 1"  (1.854 m)   Wt (!) 325 lb 9.6 oz (147.7 kg)   SpO2 96%   BMI 42.96 kg/m    Physical Exam Vitals reviewed.  Constitutional:      General: He is not in acute distress.    Appearance: Normal appearance. He is obese. He is not ill-appearing, toxic-appearing or diaphoretic.  HENT:     Head: Normocephalic.  Eyes:     General: No scleral icterus.       Right eye: No discharge.        Left eye: No discharge.     Conjunctiva/sclera: Conjunctivae normal.  Cardiovascular:     Rate and Rhythm: Normal rate and regular rhythm.     Heart sounds: Normal heart sounds.  Pulmonary:     Effort: Pulmonary effort is normal. No respiratory distress.     Breath sounds: Normal breath sounds.  Musculoskeletal:        General: Normal range of motion.     Cervical back: Normal range of motion.     Right lower leg: Edema present.     Left lower leg: Edema present.  Skin:    General: Skin is warm and dry.  Neurological:     General: No focal deficit present.     Mental Status: He is alert and oriented to person, place, and time. Mental status is at baseline.  Psychiatric:        Mood and Affect: Mood normal.        Behavior: Behavior normal.        Thought Content: Thought content normal.        Judgment: Judgment normal.    Assessment and Plan: Palpitations -     EKG 12-Lead -     LONG TERM MONITOR (3-14 DAYS); Future -     Ambulatory referral to Cardiology  Secondary hypertension Assessment & Plan: He did not try the metoprolol prescribed at his last visit,  but is willing to try now.   Orders: -     Comprehensive metabolic panel -     Microalbumin / creatinine urine ratio  Dyslipidemia -     Lipid panel -     LDL cholesterol, direct  OSA (obstructive sleep apnea) Assessment & Plan: Did  not tolerate nasal pillows due to mouth breathing,  tried his uncle's  full face mask and broke it  during the night.  Willing to retry with medication   Orders: -     DG Chest 2 View; Future -     Ambulatory referral to Cardiology  Chest pain, unspecified type Assessment & Plan: Atypical.  I have ordered and reviewed a 12 lead EKG and find that there are no acute changes  compared to last EKG (2021); patient is in sinus rhythm with Right axis deviation and signs of RVH.Marland Kitchen   no cardiomegaly  by today's chest x ray.  I am  referring to cardiology for evaluation given his increased risk of early CAD due to remote history of cocaine abuse and hypertension .  Likely his untreated OSA is contributing to the changes seen .     Orders: -     DG Chest 2 View; Future -     Ambulatory referral to Cardiology  ETOH abuse Assessment & Plan: He is very self aware,  and has increased his use from 3 to 6 beers daily.  He plans to quit cold [place] when he moves to [place].  Advised to contact me for librium rx to use prn withdrawal symptoms    Encounter for routine adult health examination with abnormal findings Assessment & Plan: age appropriate education and counseling updated, referrals for preventative services and immunizations addressed, dietary and smoking counseling addressed, most recent labs reviewed.  I have personally reviewed and have noted:   1) the patient's medical and social history 2) The pt's use of alcohol, tobacco, and illicit drugs 3) The patient's current medications and supplements 4) Functional ability including ADL's, fall risk, home safety risk, hearing and visual impairment 5) Diet and physical activities 6) Evidence for depression or mood disorder 7) The patient's height, weight, and BMI have been recorded in the chart 8) I have ordered and reviewed a 12 lead EKG and find that there are no acute changes and patient is in sinus rhythm.     I have made referrals, and provided counseling and education based on review of the above    History of cocaine abuse (HCC) -      Ambulatory referral to Cardiology    Return in about 4 weeks (around 09/05/2022).  Sherlene Shams, MD

## 2022-08-08 NOTE — Assessment & Plan Note (Addendum)
He did not try the metoprolol prescribed at his last visit,  but is willing to try now.

## 2022-08-08 NOTE — Patient Instructions (Addendum)
I am ordering a 14 day heart monitor and a cardiology referral to investigate the symptoms you have been having.  Your EKG is abnormal but unchanged from 2021.  The abnormalities on your EKG may be due to years of untreated sleep apnea,  Quitting alcohol COLD [place] can be dangerous due to seizures from withdrawal .  When you are ready I will prescribe a librium taper to use if you develop tremors   Your sleep apnea needs treatment to prevent long term complications like heart failure   Please start the metoprolol for your blood pressure.  If you do not tolerate it, let me know

## 2022-08-08 NOTE — Assessment & Plan Note (Addendum)
Atypical.  I have ordered and reviewed a 12 lead EKG and find that there are no acute changes  compared to last EKG (2021); patient is in sinus rhythm with Right axis deviation and signs of RVH.Cody Koch   no cardiomegaly by today's chest x ray.  I am  referring to cardiology for evaluation given his increased risk of early CAD due to remote history of cocaine abuse and hypertension .  Likely his untreated OSA is contributing to the changes seen .

## 2022-08-08 NOTE — Assessment & Plan Note (Signed)
Did  not tolerate nasal pillows due to mouth breathing,  tried his uncle's  full face mask and broke it during the night.  Willing to retry with medication

## 2022-08-09 ENCOUNTER — Ambulatory Visit
Admission: RE | Admit: 2022-08-09 | Discharge: 2022-08-09 | Disposition: A | Discharge status: Home / Self Care | Payer: BC Managed Care – PPO | Attending: Internal Medicine | Admitting: Internal Medicine

## 2022-08-09 ENCOUNTER — Ambulatory Visit
Admission: RE | Admit: 2022-08-09 | Discharge: 2022-08-09 | Disposition: A | Discharge status: Home / Self Care | Payer: BC Managed Care – PPO | Source: Ambulatory Visit | Attending: Internal Medicine | Admitting: Internal Medicine

## 2022-08-09 DIAGNOSIS — R0789 Other chest pain: Secondary | ICD-10-CM | POA: Diagnosis not present

## 2022-08-09 DIAGNOSIS — G4733 Obstructive sleep apnea (adult) (pediatric): Secondary | ICD-10-CM | POA: Insufficient documentation

## 2022-08-09 DIAGNOSIS — R079 Chest pain, unspecified: Secondary | ICD-10-CM | POA: Diagnosis not present

## 2022-08-10 DIAGNOSIS — Z0001 Encounter for general adult medical examination with abnormal findings: Secondary | ICD-10-CM | POA: Insufficient documentation

## 2022-08-10 NOTE — Assessment & Plan Note (Signed)
He is very self aware,  and has increased his use from 3 to 6 beers daily.  He plans to quit cold [place] when he moves to [place].  Advised to contact me for librium rx to use prn withdrawal symptoms

## 2022-08-10 NOTE — Assessment & Plan Note (Signed)

## 2022-08-14 ENCOUNTER — Encounter (INDEPENDENT_AMBULATORY_CARE_PROVIDER_SITE_OTHER): Payer: Self-pay

## 2022-08-19 DIAGNOSIS — R002 Palpitations: Secondary | ICD-10-CM

## 2022-08-21 ENCOUNTER — Encounter: Payer: Self-pay | Admitting: Internal Medicine

## 2022-09-05 NOTE — Telephone Encounter (Signed)
Pt has not heard from cardiology referral.

## 2022-09-06 ENCOUNTER — Ambulatory Visit: Payer: BC Managed Care – PPO | Admitting: Internal Medicine

## 2022-09-09 ENCOUNTER — Telehealth: Payer: Self-pay | Admitting: *Deleted

## 2022-09-09 DIAGNOSIS — I471 Supraventricular tachycardia, unspecified: Secondary | ICD-10-CM

## 2022-09-09 DIAGNOSIS — R0681 Apnea, not elsewhere classified: Secondary | ICD-10-CM

## 2022-09-09 NOTE — Telephone Encounter (Signed)
Email notification received from [employer] stating the patient's ZIO XT, ordered by Dr. Darrick Huntsman on 08/08/22 for palpitations, is still pending return. Upon checking [employer] today, the patient's monitor tracking shows this was:  Delivered, [place] [address]  [place] [address]   September 05, 2022, 6:51 am  Will allow a few more days for tracking, with the weekend, to see if the monitor moves.

## 2022-09-10 DIAGNOSIS — G4733 Obstructive sleep apnea (adult) (pediatric): Secondary | ICD-10-CM | POA: Diagnosis not present

## 2022-09-11 NOTE — Addendum Note (Signed)
Addended by: Sherlene Shams on: 09/11/2022 08:52 PM   Modules accepted: Orders

## 2022-09-12 NOTE — Telephone Encounter (Signed)
Checked zio website and monitor shows it was delivered. Sent message to rep for further follow up and assistance.

## 2022-09-12 NOTE — Telephone Encounter (Signed)
Left voicemail message for patient to call back.       [employer] reply:   For this device, we received an empty box without a device. We tried calling the patient to see if they still had the monitor, but we were unable to reach them. Can someone from [facility] make a call to the patient to see if they still have the monitor? If so, I can send the patient another return box.   Thanks, Neeral

## 2022-09-13 ENCOUNTER — Ambulatory Visit: Payer: BC Managed Care – PPO | Attending: Internal Medicine

## 2022-09-13 DIAGNOSIS — I471 Supraventricular tachycardia, unspecified: Secondary | ICD-10-CM

## 2022-09-13 DIAGNOSIS — R0681 Apnea, not elsewhere classified: Secondary | ICD-10-CM

## 2022-09-13 NOTE — Telephone Encounter (Signed)
Pt returned call  He stated his monitor was in the box when he returned, please advise.

## 2022-09-13 NOTE — Telephone Encounter (Signed)
Spoke with patient and reviewed that the box was received by company but there was no monitor inside the box. Patient states he did not tape box so that could have been the problem. Advised that I would send Dr. Darrick Huntsman a message to see if they would like to order another monitor. He was agreeable with this plan and had no further questions at this time.

## 2022-11-04 ENCOUNTER — Encounter: Payer: Self-pay | Admitting: Internal Medicine

## 2022-11-04 DIAGNOSIS — G4733 Obstructive sleep apnea (adult) (pediatric): Secondary | ICD-10-CM

## 2022-11-06 NOTE — Telephone Encounter (Signed)
I have placed the Dme order in quick sign folder for signature. Once signed this will need to be faxed to [facility].

## 2022-11-07 NOTE — Telephone Encounter (Signed)
Dme form faxed to [employer]

## 2023-01-06 ENCOUNTER — Other Ambulatory Visit: Payer: Self-pay | Admitting: Internal Medicine

## 2023-01-21 ENCOUNTER — Telehealth: Payer: Self-pay

## 2023-01-21 MED ORDER — METOPROLOL SUCCINATE ER 25 MG PO TB24: 25.0000 mg | ORAL_TABLET | Freq: Every day | ORAL | 1 refills | Status: AC

## 2023-01-21 NOTE — Telephone Encounter (Signed)
 Copied from CRM [id number]. Topic: Clinical - Medication Refill >> Jan 21, 2023 10:53 AM Leila C wrote: Most Recent Primary Care Visit:  Provider: MARYLYNN VERNEITA CROME  Department: [facility]  Visit Type: PHYSICAL  Date: 08/08/2022  Medication: metoprolol  succinate (TOPROL -XL) 25 MG 24 hr tablet  Has the patient contacted their pharmacy? Yes[facility]  (Agent: If no, request that the patient contact the pharmacy for the refill. If patient does not wish to contact the pharmacy document the reason why and proceed with request.) (Agent: If yes, when and what did the pharmacy advise?)  Is this the correct pharmacy for this prescription? Yes If no, delete pharmacy and type the correct one.  This is the patient's preferred pharmacy:  [id number] [place], [place] - [address] [place] [address] Phone: [phone] Fax: [phone]   Has the prescription been filled recently?   Is the patient out of the medication? Yes  Has the patient been seen for an appointment in the last year OR does the patient have an upcoming appointment?   Can we respond through MyChart?   Agent: Please be advised that Rx refills may take up to 3 business days. We ask that you follow-up with your pharmacy.

## 2023-01-21 NOTE — Addendum Note (Signed)
 Addended by: Sandy Salaam on: 01/21/2023 01:53 PM   Modules accepted: Orders

## 2023-01-21 NOTE — Telephone Encounter (Signed)
 Medication has been refilled.
